# Patient Record
Sex: Male | Born: 1945
Health system: Southern US, Community
[De-identification: ages and names within clinical notes are randomized; demographics above are authoritative.]

## PROBLEM LIST (undated history)

## (undated) DIAGNOSIS — E039 Hypothyroidism, unspecified: Secondary | ICD-10-CM

## (undated) DIAGNOSIS — Z973 Presence of spectacles and contact lenses: Secondary | ICD-10-CM

## (undated) DIAGNOSIS — I2510 Atherosclerotic heart disease of native coronary artery without angina pectoris: Secondary | ICD-10-CM

## (undated) DIAGNOSIS — E785 Hyperlipidemia, unspecified: Secondary | ICD-10-CM

## (undated) DIAGNOSIS — Z9989 Dependence on other enabling machines and devices: Secondary | ICD-10-CM

## (undated) DIAGNOSIS — I10 Essential (primary) hypertension: Secondary | ICD-10-CM

## (undated) DIAGNOSIS — C61 Malignant neoplasm of prostate: Secondary | ICD-10-CM

## (undated) DIAGNOSIS — N400 Enlarged prostate without lower urinary tract symptoms: Secondary | ICD-10-CM

## (undated) DIAGNOSIS — R42 Dizziness and giddiness: Secondary | ICD-10-CM

## (undated) DIAGNOSIS — I7090 Unspecified atherosclerosis: Secondary | ICD-10-CM

## (undated) DIAGNOSIS — G3184 Mild cognitive impairment, so stated: Secondary | ICD-10-CM

## (undated) DIAGNOSIS — N3281 Overactive bladder: Secondary | ICD-10-CM

## (undated) DIAGNOSIS — M170 Bilateral primary osteoarthritis of knee: Secondary | ICD-10-CM

## (undated) DIAGNOSIS — R7303 Prediabetes: Secondary | ICD-10-CM

## (undated) HISTORY — PX: PROSTATE BIOPSY: SHX241

## (undated) HISTORY — PX: KNEE SURGERY: SHX244

## (undated) HISTORY — PX: FRACTURE SURGERY: SHX138

## (undated) HISTORY — PX: CORONARY ARTERY BYPASS GRAFT: SHX141

## (undated) HISTORY — PX: OTHER SURGICAL HISTORY: SHX169

## (undated) HISTORY — DX: Atherosclerotic heart disease of native coronary artery without angina pectoris: I25.10

## (undated) HISTORY — DX: Benign prostatic hyperplasia without lower urinary tract symptoms: N40.0

## (undated) HISTORY — DX: Hyperlipidemia, unspecified: E78.5

---

## 2014-02-25 ENCOUNTER — Encounter: Payer: Self-pay | Admitting: *Deleted

## 2017-01-12 ENCOUNTER — Encounter: Payer: Self-pay | Admitting: Family Medicine

## 2017-01-12 ENCOUNTER — Ambulatory Visit (INDEPENDENT_AMBULATORY_CARE_PROVIDER_SITE_OTHER): Payer: Medicare Other | Admitting: Family Medicine

## 2017-01-12 VITALS — BP 110/68 | HR 64 | Temp 98.0°F | Resp 14 | Ht 66.0 in | Wt 167.0 lb

## 2017-01-12 DIAGNOSIS — E7800 Pure hypercholesterolemia, unspecified: Secondary | ICD-10-CM

## 2017-01-12 DIAGNOSIS — Z125 Encounter for screening for malignant neoplasm of prostate: Secondary | ICD-10-CM

## 2017-01-12 DIAGNOSIS — Z7689 Persons encountering health services in other specified circumstances: Secondary | ICD-10-CM | POA: Diagnosis not present

## 2017-01-12 DIAGNOSIS — I2510 Atherosclerotic heart disease of native coronary artery without angina pectoris: Secondary | ICD-10-CM

## 2017-01-12 LAB — CBC WITH DIFFERENTIAL/PLATELET
Basophils Absolute: 0 cells/uL (ref 0–200)
Basophils Relative: 0 %
EOS PCT: 2 %
Eosinophils Absolute: 138 cells/uL (ref 15–500)
HCT: 48.8 % (ref 38.5–50.0)
Hemoglobin: 16.4 g/dL (ref 13.0–17.0)
LYMPHS PCT: 40 %
Lymphs Abs: 2760 cells/uL (ref 850–3900)
MCH: 29.8 pg (ref 27.0–33.0)
MCHC: 33.6 g/dL (ref 32.0–36.0)
MCV: 88.7 fL (ref 80.0–100.0)
MPV: 9.7 fL (ref 7.5–12.5)
Monocytes Absolute: 552 cells/uL (ref 200–950)
Monocytes Relative: 8 %
Neutro Abs: 3450 cells/uL (ref 1500–7800)
Neutrophils Relative %: 50 %
Platelets: 230 10*3/uL (ref 140–400)
RBC: 5.5 MIL/uL (ref 4.20–5.80)
RDW: 13.9 % (ref 11.0–15.0)
WBC: 6.9 10*3/uL (ref 3.8–10.8)

## 2017-01-12 LAB — COMPLETE METABOLIC PANEL WITH GFR
ALBUMIN: 4.1 g/dL (ref 3.6–5.1)
ALT: 31 U/L (ref 9–46)
AST: 23 U/L (ref 10–35)
Alkaline Phosphatase: 47 U/L (ref 40–115)
BUN: 14 mg/dL (ref 7–25)
CALCIUM: 9.4 mg/dL (ref 8.6–10.3)
CO2: 25 mmol/L (ref 20–31)
Chloride: 102 mmol/L (ref 98–110)
Creat: 1.18 mg/dL (ref 0.70–1.18)
GFR, Est African American: 72 mL/min (ref >=60)
GFR, Est Non African American: 62 mL/min (ref >=60)
Glucose, Bld: 90 mg/dL (ref 70–99)
Potassium: 4.7 mmol/L (ref 3.5–5.3)
SODIUM: 137 mmol/L (ref 135–146)
TOTAL PROTEIN: 6.8 g/dL (ref 6.1–8.1)
Total Bilirubin: 0.5 mg/dL (ref 0.2–1.2)

## 2017-01-12 LAB — LIPID PANEL
CHOL/HDL RATIO: 4 ratio (ref <5.0)
CHOLESTEROL: 159 mg/dL (ref <200)
HDL: 40 mg/dL — ABNORMAL LOW (ref >40)
LDL Cholesterol: 95 mg/dL (ref <100)
Triglycerides: 121 mg/dL (ref <150)
VLDL: 24 mg/dL (ref <30)

## 2017-01-12 NOTE — Progress Notes (Signed)
Subjective:    Patient ID: Corey Glover, male    DOB: 01/05/1946, 71 y.o.   MRN: [id number]  HPI Patient is a very pleasant 71 year old Caucasian male here today to establish care. In a proximally 1997, the patient states that he had a 6 vessel CABG performed by Dr. Laneta Simmers. At that time he was also under the care of Dr. Myrtis Ser.  Shortly thereafter, he lost his insurance when his company close the plantar. Since that time he has been seeking his medical care at the [place]. He has not been seen by cardiologist his knowledge in more than a decade. He has not had a stress test or echocardiogram performed in more than 15 years he believes. However he is asymptomatic and otherwise doing well. He denies any chest pain shortness of breath or dyspnea on exertion. He states that his last colonoscopy was in 2010 and he is due again in 2020. He is due for prostate cancer screening with a PSA. He states that he's had a hepatitis C screening test. He is certain that he has had both pneumonia vaccines including Pneumovax 23 as well as Prevnar 13. In epic, and states that he had a valve replacement. There is no other information provided. Patient denies that he has had a valve replaced. He denies ever having been on Coumadin or having a valve replaced.  I can find no other information in his chart and therefore I removed this from his history. Past Medical History:  Diagnosis Date  . CAD in native artery   . Hyperlipidemia    Past Surgical History:  Procedure Laterality Date  . CORONARY ARTERY BYPASS GRAFT     1997 6 vessel (Dr. Laneta Simmers)  . FRACTURE SURGERY     leg as a child   Current Outpatient Prescriptions on File Prior to Visit  Medication Sig Dispense Refill  . ezetimibe (ZETIA) 10 MG tablet Take 5 mg by mouth daily.    . metoprolol succinate (TOPROL-XL) 25 MG 24 hr tablet Take 12.5 mg by mouth daily.    . rosuvastatin (CRESTOR) 40 MG tablet Take 20 mg by mouth every other day.    . vitamin B-12  (CYANOCOBALAMIN) 1000 MCG tablet Take 1,000 mcg by mouth daily.     No current facility-administered medications on file prior to visit.    No Known Allergies Social History   Social History  . Marital status: Single    Spouse name: N/A  . Number of children: N/A  . Years of education: N/A   Occupational History  . Not on file.   Social History Main Topics  . Smoking status: Never Smoker  . Smokeless tobacco: Never Used  . Alcohol use No  . Drug use: No  . Sexual activity: Not Currently   Other Topics Concern  . Not on file   Social History Narrative  . No narrative on file   Family History  Problem Relation Age of Onset  . Heart disease Father   . Heart disease Brother    Brother also has cancer   Review of Systems  All other systems reviewed and are negative.      Objective:   Physical Exam  Constitutional: He is oriented to person, place, and time. He appears well-developed and well-nourished. No distress.  HENT:  Head: Normocephalic and atraumatic.  Right Ear: External ear normal.  Left Ear: External ear normal.  Nose: Nose normal.  Mouth/Throat: Oropharynx is clear and moist. No oropharyngeal exudate.  Eyes: Pupils are equal, round, and reactive to light. Conjunctivae and EOM are normal. Right eye exhibits no discharge. Left eye exhibits no discharge. No scleral icterus.  Neck: Normal range of motion. Neck supple. No JVD present. No tracheal deviation present. No thyromegaly present.  Cardiovascular: Normal rate, regular rhythm, normal heart sounds and intact distal pulses.  Exam reveals no gallop and no friction rub.   No murmur heard. Pulmonary/Chest: Effort normal and breath sounds normal. No stridor. No respiratory distress. He has no wheezes. He has no rales. He exhibits no tenderness.  Abdominal: Soft. Bowel sounds are normal. He exhibits no distension and no mass. There is no tenderness. There is no rebound and no guarding.  Musculoskeletal: He  exhibits no edema.  Lymphadenopathy:    He has no cervical adenopathy.  Neurological: He is alert and oriented to person, place, and time. He has normal reflexes. No cranial nerve deficit. He exhibits normal muscle tone. Coordination normal.  Skin: Skin is warm. No rash noted. He is not diaphoretic. No erythema. No pallor.  Vitals reviewed.         Assessment & Plan:  Prostate cancer screening - Plan: PSA  CAD in native artery - Plan: CBC with Differential/Platelet, Lipid panel, COMPLETE METABOLIC PANEL WITH GFR, Ambulatory referral to Cardiology  Encounter to establish care with new doctor  Pure hypercholesterolemia  Patient's exam today is unremarkable. Immunizations are up-to-date. He also reports that he's had a shingles vaccine. Hepatitis C screening is up-to-date. Colonoscopy is not due again until 2020. His blood pressure today is excellent. He is on an aspirin. I will check a fasting lipid panel. Goal LDL cholesterol is less than 70. I will also check a CBC as well as a CMP. I will screen the patient for prostate cancer with a PSA. I am concerned by the fact the patient has not seen a cardiologist in more than a decade given his past history. I think it will be prudent to reestablish him with his cardiologist. He may require a stress test given his previous history. However the patient is asymptomatic and therefore I do not believe that this is urgent.

## 2017-01-13 LAB — PSA: PSA: 1.4 ng/mL (ref <=4.0)

## 2017-01-14 ENCOUNTER — Encounter: Payer: Self-pay | Admitting: Family Medicine

## 2017-01-30 ENCOUNTER — Encounter: Payer: Self-pay | Admitting: *Deleted

## 2017-02-01 NOTE — Progress Notes (Signed)
Cardiology Office Note   Date:  02/02/2017   ID:  Corey Glover, DOB 04-15-1946, MRN [id number]  PCP:  Donita Brooks, MD  Cardiologist:   Charlton Haws, MD   No chief complaint on file.     History of Present Illness: Corey Glover is a 71 y.o. male who presents for consultation regarding CAD and distant CABG. Referred by Dr Tanya Nones. Previousl seen by Dr Myrtis Ser. Had CABG with Dr Laneta Simmers in 1997 CRF;s include hyperlipidemia on statin and zetia. 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 Has arthritis and myalgias with statins  [place] and found to have CAD Has never had SSCP. No dyspnea palpitations or syncope Occasional indigestions That improves with burping    Past Medical History:  Diagnosis Date  . CAD in native artery   . Hyperlipidemia     Past Surgical History:  Procedure Laterality Date  . CORONARY ARTERY BYPASS GRAFT     1997 6 vessel (Dr. Laneta Simmers)  . FRACTURE SURGERY     leg as a child     Current Outpatient Prescriptions  Medication Sig Dispense Refill  . aspirin EC 81 MG tablet Take 81 mg by mouth daily.    Marland Kitchen ezetimibe (ZETIA) 10 MG tablet Take 5 mg by mouth daily.    . metoprolol succinate (TOPROL-XL) 25 MG 24 hr tablet Take 12.5 mg by mouth daily.    . rosuvastatin (CRESTOR) 40 MG tablet Take 20 mg by mouth every other day.     No current facility-administered medications for this visit.     Allergies:   Patient has no known allergies.    Social History:  The patient  reports that he has never smoked. He has never used smokeless tobacco. He reports that he does not drink alcohol or use drugs.   Family History:  The patient's family history includes Heart disease in his brother and father.    ROS:  Please see the history of present illness.   Otherwise, review of systems are positive for none.   All other systems are reviewed  and negative.    PHYSICAL EXAM: VS:  BP 114/74 (BP Location: Left Arm)   Pulse 82   Ht 5\' 6"  (1.676 m)   Wt 168 lb (76.2 kg)   SpO2 96%   BMI 27.12 kg/m  , BMI Body mass index is 27.12 kg/m. Affect appropriate Healthy:  appears stated age HEENT: normal Neck supple with no adenopathy JVP normal no bruits no thyromegaly Lungs clear with no wheezing and good diaphragmatic motion Heart:  S1/S2 no murmur, no rub, gallop or click PMI normal Abdomen: benighn, BS positve, no tenderness, no AAA no bruit.  No HSM or HJR Distal pulses intact with no bruits No edema Neuro non-focal Skin warm and dry No muscular weakness    EKG:  SR rate 74 ICRBBB possible old IMI    Recent Labs: 01/12/2017: ALT 31; BUN 14; Creat 1.18; Hemoglobin 16.4; Platelets 230; Potassium 4.7; Sodium 137    Lipid Panel    Component Value Date/Time   CHOL 159 01/12/2017 1054   TRIG 121 01/12/2017 1054   HDL 40 (L) 01/12/2017 1054   CHOLHDL 4.0 01/12/2017 1054   VLDL 24 01/12/2017 1054   LDLCALC 95 01/12/2017 1054      Wt Readings from Last 3 Encounters:  02/02/17 168 lb (76.2 kg)  01/12/17 167 lb (75.8 kg)      Other studies Reviewed: Additional studies/ records that were reviewed today include: Notes primary and labs Old CABG report 73 [address].    ASSESSMENT AND PLAN:  1.  CAD/CABG:  Distant 1997 Dr Laneta Simmers Asymptomatic f/u Ex Myovue since he had no warning signs beofre And grafts more than 71 years old  2. Cholesterol. LDL under 100 continue statin  3.    Current medicines are reviewed at length with the patient today.  The patient does not have concerns regarding medicines.  The following changes have been made:  no change  Labs/ tests ordered today include: Ex Myovue   Orders Placed This Encounter  Procedures  . EKG 12-Lead     Disposition:   FU with cardiology in a year      Signed, Charlton Haws, MD  02/02/2017 1:52 PM    [facility] [address] [place], [place], [place]  [address] Phone: [phone]; Fax: [phone]

## 2017-02-02 ENCOUNTER — Encounter: Payer: Self-pay | Admitting: Cardiovascular Disease

## 2017-02-02 ENCOUNTER — Ambulatory Visit (INDEPENDENT_AMBULATORY_CARE_PROVIDER_SITE_OTHER): Payer: Medicare Other | Admitting: Cardiovascular Disease

## 2017-02-02 VITALS — BP 114/74 | HR 82 | Ht 66.0 in | Wt 168.0 lb

## 2017-02-02 DIAGNOSIS — I25119 Atherosclerotic heart disease of native coronary artery with unspecified angina pectoris: Secondary | ICD-10-CM | POA: Diagnosis not present

## 2017-02-02 NOTE — Patient Instructions (Signed)
Medication Instructions:  Your physician recommends that you continue on your current medications as directed. Please refer to the Current Medication list given to you today.   Labwork: NONE  Testing/Procedures: Your physician has requested that you have en exercise stress myoview. For further information please visit [email or web address]. Please follow instruction sheet, as given.    Follow-Up: Your physician wants you to follow-up in: 1 YEAR .  You will receive a reminder letter in the mail two months in advance. If you don't receive a letter, please call our office to schedule the follow-up appointment.   Any Other Special Instructions Will Be Listed Below (If Applicable).     If you need a refill on your cardiac medications before your next appointment, please call your pharmacy.   

## 2017-02-02 NOTE — Addendum Note (Signed)
Addended by: Abelino Derrick R on: 02/02/2017 01:58 PM   Modules accepted: Orders

## 2017-02-06 ENCOUNTER — Encounter (HOSPITAL_COMMUNITY)
Admission: RE | Admit: 2017-02-06 | Discharge: 2017-02-06 | Disposition: A | Discharge status: Home / Self Care | Payer: Medicare Other | Source: Ambulatory Visit | Attending: Cardiovascular Disease | Admitting: Cardiovascular Disease

## 2017-02-06 ENCOUNTER — Encounter (HOSPITAL_BASED_OUTPATIENT_CLINIC_OR_DEPARTMENT_OTHER)
Admission: RE | Admit: 2017-02-06 | Discharge: 2017-02-06 | Disposition: A | Discharge status: Home / Self Care | Payer: Medicare Other | Source: Ambulatory Visit | Attending: Cardiovascular Disease | Admitting: Cardiovascular Disease

## 2017-02-06 ENCOUNTER — Encounter (HOSPITAL_COMMUNITY): Payer: Self-pay

## 2017-02-06 DIAGNOSIS — I25119 Atherosclerotic heart disease of native coronary artery with unspecified angina pectoris: Secondary | ICD-10-CM

## 2017-02-06 LAB — NM MYOCAR MULTI W/SPECT W/WALL MOTION / EF
CHL CUP NUCLEAR SDS: 0
CHL CUP RESTING HR STRESS: 58 {beats}/min
CSEPEDS: 1 s
CSEPPHR: 118 {beats}/min
Estimated workload: 10.1 METS
Exercise duration (min): 7 min
LVDIAVOL: 75 mL (ref 62–150)
LVSYSVOL: 29 mL
MPHR: 150 {beats}/min
Percent HR: 78 %
RATE: 0.34
RPE: 13
SRS: 1
SSS: 1
TID: 1.18

## 2017-02-06 MED ORDER — SODIUM CHLORIDE 0.9% FLUSH
INTRAVENOUS | Status: AC
  Administered 2017-02-06: 10 mL via INTRAVENOUS
  Filled 2017-02-06: qty 10

## 2017-02-06 MED ORDER — TECHNETIUM TC 99M TETROFOSMIN IV KIT
10.0000 | PACK | Freq: Once | INTRAVENOUS | Status: AC | PRN
  Administered 2017-02-06: 11 via INTRAVENOUS

## 2017-02-06 MED ORDER — TECHNETIUM TC 99M TETROFOSMIN IV KIT
30.0000 | PACK | Freq: Once | INTRAVENOUS | Status: AC | PRN
  Administered 2017-02-06: 33 via INTRAVENOUS

## 2017-02-06 MED ORDER — REGADENOSON 0.4 MG/5ML IV SOLN
INTRAVENOUS | Status: AC
  Administered 2017-02-06: 0.4 mg via INTRAVENOUS
  Filled 2017-02-06: qty 5

## 2020-07-02 ENCOUNTER — Encounter (INDEPENDENT_AMBULATORY_CARE_PROVIDER_SITE_OTHER): Payer: Self-pay | Admitting: Internal Medicine

## 2020-07-02 ENCOUNTER — Ambulatory Visit (INDEPENDENT_AMBULATORY_CARE_PROVIDER_SITE_OTHER): Payer: Medicare Other | Admitting: Internal Medicine

## 2020-07-02 ENCOUNTER — Other Ambulatory Visit: Payer: Self-pay

## 2020-07-02 VITALS — BP 114/78 | HR 59 | Temp 97.5°F | Ht 63.0 in | Wt 170.8 lb

## 2020-07-02 DIAGNOSIS — I2510 Atherosclerotic heart disease of native coronary artery without angina pectoris: Secondary | ICD-10-CM | POA: Diagnosis not present

## 2020-07-02 DIAGNOSIS — K625 Hemorrhage of anus and rectum: Secondary | ICD-10-CM

## 2020-07-02 DIAGNOSIS — E785 Hyperlipidemia, unspecified: Secondary | ICD-10-CM | POA: Diagnosis not present

## 2020-07-02 DIAGNOSIS — N401 Enlarged prostate with lower urinary tract symptoms: Secondary | ICD-10-CM | POA: Diagnosis not present

## 2020-07-02 DIAGNOSIS — M25562 Pain in left knee: Secondary | ICD-10-CM

## 2020-07-02 DIAGNOSIS — R350 Frequency of micturition: Secondary | ICD-10-CM

## 2020-07-02 DIAGNOSIS — G8929 Other chronic pain: Secondary | ICD-10-CM

## 2020-07-02 NOTE — Progress Notes (Signed)
Metrics: Intervention Frequency ACO  Documented Smoking Status Yearly  Screened one or more times in 24 months  Cessation Counseling or  Active cessation medication Past 24 months  Past 24 months   Guideline developer: UpToDate (See UpToDate for funding source) Date Released: 2014       Wellness Office Visit  Subjective:  Patient ID: Corey Glover, male    DOB: 1945/10/02  Age: 75 y.o. MRN: [id number]  CC: This 75 year old man comes to our practice as a new patient to establish care. His previous physician apparently is not in his network regarding his insurance and he was given the information from his insurance, need to come and see [place].  HPI  He has a history of coronary artery disease, having had CABG in 1997. He has not seen a cardiologist for many years and thankfully has not had any problems either in terms of symptoms. He also has dyslipidemia and takes statin and Zetia. He also has BPH and takes tamsulosin which seems to help him. Currently he is having problem with rectal bleeding any feels that this is hemorrhoids. He has not had a colonoscopy for many years. He is also complaining of left knee pain which has been present for at least the last 1 year. It seems to be getting worse. At times he has had swelling of the left knee. Past Medical History:  Diagnosis Date  . CAD in native artery   . Hyperlipidemia    Past Surgical History:  Procedure Laterality Date  . CORONARY ARTERY BYPASS GRAFT     1997 6 vessel (Dr. Laneta Simmers)  . FRACTURE SURGERY     leg as a child     Family History  Problem Relation Age of Onset  . Heart disease Father   . Heart disease Brother     Social History   Social History Narrative   Divorced since 2000.Son lives with him.Retired ,used to work in Publishing rights manager.Ex-military.   Social History   Tobacco Use  . Smoking status: Never Smoker  . Smokeless tobacco: Never Used  Substance Use Topics  . Alcohol use: No    Current Meds   Medication Sig  . aspirin EC 81 MG tablet Take 81 mg by mouth daily.  . cholecalciferol (VITAMIN D3) 25 MCG (1000 UNIT) tablet Take 1,000 Units by mouth daily.  Marland Kitchen ezetimibe (ZETIA) 10 MG tablet Take 5 mg by mouth daily.  . metoprolol succinate (TOPROL-XL) 25 MG 24 hr tablet Take 12.5 mg by mouth daily.  . pravastatin (PRAVACHOL) 40 MG tablet Take 40 mg by mouth daily.  . tamsulosin (FLOMAX) 0.4 MG CAPS capsule Take 0.4 mg by mouth.      Depression screen [facility] 2/9 07/02/2020  Decreased Interest 0  Down, Depressed, Hopeless 0  PHQ - 2 Score 0  Altered sleeping 0  Tired, decreased energy 0  Change in appetite 0  Feeling bad or failure about yourself  0  Trouble concentrating 0  Moving slowly or fidgety/restless 0  Suicidal thoughts 0  PHQ-9 Score 0  Difficult doing work/chores Not difficult at all     Objective:   Today's Vitals: BP 114/78   Pulse (!) 59   Temp (!) 97.5 F (36.4 C) (Temporal)   Ht 5\' 3"  (1.6 m) Comment: Patient had his shoes on  Wt 170 lb 12.8 oz (77.5 kg)   SpO2 96%   BMI 30.26 kg/m  Vitals with BMI 07/02/2020 02/02/2017 02/02/2017  Height 5\' 3"  - 5\' 6"   Weight 170 lbs 13 oz - 168 lbs  BMI 30.26 - 27.2  Systolic 114 114 062  Diastolic 78 74 78  Pulse 59 - 82     Physical Exam  He looks somewhat frail for his age. Blood pressure is in good control. No swelling of the left knee. Alert and orientated without any obvious focal neurological signs.     Assessment   1. Rectal bleeding   2. CAD in native artery   3. Dyslipidemia   4. Benign prostatic hyperplasia with urinary frequency   5. Chronic pain of left knee       Tests ordered Orders Placed This Encounter  Procedures  . CBC  . COMPLETE METABOLIC PANEL WITH GFR  . PSA, Total with Reflex to PSA, Free  . Lipid panel  . Ambulatory referral to Gastroenterology  . Ambulatory referral to Orthopedic Surgery     Plan: 1. As far as his rectal bleeding is concerned, I will refer him to  gastroenterology. 2. I will refer him to orthopedics for his chronic left knee pain. He may well have significant osteoarthritis which may require treatment definitively. 3. Blood work is ordered above. 4. He will continue continue with tamsulosin for his BPH. 5. Will continue with statin therapy as well as Zetia for his dyslipidemia. 6. Further recommendations will depend on results and I will have him follow-up with Sarah in about 3 months time.   No orders of the defined types were placed in this encounter.   Wilson Singer, MD

## 2020-07-03 ENCOUNTER — Encounter: Payer: Self-pay | Admitting: Internal Medicine

## 2020-07-03 LAB — COMPLETE METABOLIC PANEL WITH GFR
AG Ratio: 1.6 (calc) (ref 1.0–2.5)
ALT: 32 U/L (ref 9–46)
AST: 19 U/L (ref 10–35)
Albumin: 4 g/dL (ref 3.6–5.1)
Alkaline phosphatase (APISO): 41 U/L (ref 35–144)
BUN: 16 mg/dL (ref 7–25)
CO2: 27 mmol/L (ref 20–32)
Calcium: 9.5 mg/dL (ref 8.6–10.3)
Chloride: 104 mmol/L (ref 98–110)
Creat: 1.11 mg/dL (ref 0.70–1.18)
GFR, Est African American: 75 mL/min/{1.73_m2} (ref >=60)
GFR, Est Non African American: 65 mL/min/{1.73_m2} (ref >=60)
Globulin: 2.5 g/dL (calc) (ref 1.9–3.7)
Glucose, Bld: 99 mg/dL (ref 65–139)
Potassium: 4.8 mmol/L (ref 3.5–5.3)
Sodium: 137 mmol/L (ref 135–146)
Total Bilirubin: 0.3 mg/dL (ref 0.2–1.2)
Total Protein: 6.5 g/dL (ref 6.1–8.1)

## 2020-07-03 LAB — CBC
HCT: 46.2 % (ref 38.5–50.0)
Hemoglobin: 15.7 g/dL (ref 13.2–17.1)
MCH: 29.5 pg (ref 27.0–33.0)
MCHC: 34 g/dL (ref 32.0–36.0)
MCV: 86.7 fL (ref 80.0–100.0)
MPV: 10.5 fL (ref 7.5–12.5)
Platelets: 227 10*3/uL (ref 140–400)
RBC: 5.33 10*6/uL (ref 4.20–5.80)
RDW: 13.1 % (ref 11.0–15.0)
WBC: 6.4 10*3/uL (ref 3.8–10.8)

## 2020-07-03 LAB — LIPID PANEL
Cholesterol: 141 mg/dL (ref <200)
HDL: 41 mg/dL (ref >=40)
LDL Cholesterol (Calc): 84 mg/dL (calc)
Non-HDL Cholesterol (Calc): 100 mg/dL (calc) (ref <130)
Total CHOL/HDL Ratio: 3.4 (calc) (ref <5.0)
Triglycerides: 73 mg/dL (ref <150)

## 2020-07-03 LAB — PSA, TOTAL WITH REFLEX TO PSA, FREE: PSA, Total: 0.3 ng/mL (ref <=4.0)

## 2020-07-04 ENCOUNTER — Encounter (INDEPENDENT_AMBULATORY_CARE_PROVIDER_SITE_OTHER): Payer: Self-pay | Admitting: Internal Medicine

## 2020-07-11 ENCOUNTER — Ambulatory Visit (INDEPENDENT_AMBULATORY_CARE_PROVIDER_SITE_OTHER): Payer: Medicare Other | Admitting: Orthopedic Surgery

## 2020-07-11 ENCOUNTER — Ambulatory Visit: Payer: Medicare Other

## 2020-07-11 ENCOUNTER — Encounter: Payer: Self-pay | Admitting: Orthopedic Surgery

## 2020-07-11 ENCOUNTER — Other Ambulatory Visit: Payer: Self-pay

## 2020-07-11 VITALS — BP 137/74 | HR 70 | Ht 63.0 in | Wt 165.0 lb

## 2020-07-11 DIAGNOSIS — M1711 Unilateral primary osteoarthritis, right knee: Secondary | ICD-10-CM

## 2020-07-11 DIAGNOSIS — G8929 Other chronic pain: Secondary | ICD-10-CM

## 2020-07-11 DIAGNOSIS — M1712 Unilateral primary osteoarthritis, left knee: Secondary | ICD-10-CM | POA: Diagnosis not present

## 2020-07-11 NOTE — Progress Notes (Signed)
New Patient Visit  Assessment: Corey Glover is a 75 y.o. male with the following:  Plan: SHUNSUKE GRANZOW has arthritis in bilateral knees.  We reviewed the radiographs in clinic today, and I outlined the natural progression.  His right knee radiographs demonstrates severe arthritis, but this remains asymptomatic.  His left knee demonstrates moderate arthritis on radiographs, but currently has significant pain.    We had an extensive discussion regarding all potential treatment options, including continuing with the current treatment. NSAIDs are the most appropriate medications, and these are available OTC or via prescription.  I have urged them to remain active, and they can continue with activities on their own, or we can refer them to physical therapy.  We can also consider a brace, or compression sleeve. If the pain is severe enough, we can consider a steroid injection.  If their knee pain is affecting their everyday activities, including sleep, knee replacement is a consideration, but we would have to refer them to see my partner Dr. Romeo Apple.  After discussing all of these options, the patient has elected to proceed with his current regimen of topical ointments, as well as a left knee steroid injection.  Procedure note injection Left knee joint   Verbal consent was obtained to inject the left knee joint  Timeout was completed to confirm the site of injection.  The skin was prepped with alcohol and ethyl chloride was sprayed at the injection site.  A 21-gauge needle was used to inject 40 mg of Depo-Medrol and 1% lidocaine (3 cc) into the left knee using an anterolateral approach.  There were no complications. A sterile bandage was applied.     Follow-up: Return if symptoms worsen or fail to improve.  Subjective:  Chief Complaint  Patient presents with  . Knee Pain    Bilateral but Lt > Rt.  Getting worse over past 2-3 yrs.    History of Present Illness: Corey Glover  is a 75 y.o. male who has been referred to clinic today by Lilly Cove, MD for left knee pain.  He has had pain in the left knee for the past 3-4 months.  No specific injury.  He does not note any swelling.  He is currently using 2 different types of topical medications with some improvement in his symptoms.  He has never had an injection.  He has not worked with physical therapy.  He has some pain in his right knee, but this is tolerable at this time.  He notes a remote history of surgery on his right knee for a torn ligament.  The pain in his left knee is primarily posterior aspect, as well as some pain medially.   Review of Systems: No fevers or chills No numbness or tingling No chest pain No shortness of breath No bowel or bladder dysfunction No GI distress No headaches   Medical History:  Past Medical History:  Diagnosis Date  . CAD in native artery   . Hyperlipidemia     Past Surgical History:  Procedure Laterality Date  . CORONARY ARTERY BYPASS GRAFT     1997 6 vessel (Dr. Laneta Simmers)  . FRACTURE SURGERY     leg as a child    Family History  Problem Relation Age of Onset  . Heart disease Father   . Heart disease Brother    Social History   Tobacco Use  . Smoking status: Never Smoker  . Smokeless tobacco: Never Used  Vaping Use  . Vaping  Use: Never used  Substance Use Topics  . Alcohol use: No  . Drug use: No    No Known Allergies  Current Meds  Medication Sig  . aspirin EC 81 MG tablet Take 81 mg by mouth daily.  . cholecalciferol (VITAMIN D3) 25 MCG (1000 UNIT) tablet Take 1,000 Units by mouth daily.  Marland Kitchen ezetimibe (ZETIA) 10 MG tablet Take 5 mg by mouth daily.  . metoprolol succinate (TOPROL-XL) 25 MG 24 hr tablet Take 12.5 mg by mouth daily.  . pravastatin (PRAVACHOL) 40 MG tablet Take 40 mg by mouth daily.  . tamsulosin (FLOMAX) 0.4 MG CAPS capsule Take 0.4 mg by mouth.    Objective: BP 137/74   Pulse 70   Ht 5\' 3"  (1.6 m)   Wt 165 lb (74.8 kg)    BMI 29.23 kg/m   Physical Exam:  General: Alert and oriented, no acute distress Gait: Slow, left-sided antalgic gait  Evaluation of the right knee demonstrates well-healed surgical incisions without surrounding erythema or drainage.  No effusion is appreciated.  Range of motion from 10-120 degrees.  Mild crepitus is appreciated.  Negative Lachman.  No tenderness palpation over the medial lateral joint lines.  Baker's cyst is palpated posterior aspect of the knee, which is nontender.  Evaluation of the left knee demonstrates no effusion.  He has full range of motion from 0-130 degrees without discomfort.  Mild crepitus is appreciated.  Negative Lachman.  Mild tenderness palpation along the medial and lateral joint lines.  Tenderness palpation of posterior aspect of the knee consistent with his Baker's cyst.  Sensation is intact distally bilaterally    IMAGING: I personally ordered and reviewed the following images  X-ray of the right knee demonstrates advanced degenerative changes with bone-on-bone articulations within the medial lateral compartments.  There are significant osteophytes within the patellofemoral compartment with almost complete loss of joint space.  Impression: Severe right knee arthritis  X-ray of the left knee demonstrates moderate degenerative changes with loss of joint space within the medial and lateral compartments.  Some osteophytes are appreciated within the patellofemoral compartment.  Impression: Moderate left knee arthritis   New Medications:  No orders of the defined types were placed in this encounter.     , MD  07/11/2020 12:08 PM

## 2020-07-11 NOTE — Patient Instructions (Signed)

## 2020-08-07 ENCOUNTER — Ambulatory Visit: Payer: Medicare Other | Admitting: Gastroenterology

## 2020-08-07 ENCOUNTER — Encounter: Payer: Self-pay | Admitting: Gastroenterology

## 2020-08-07 ENCOUNTER — Other Ambulatory Visit: Payer: Self-pay

## 2020-08-07 DIAGNOSIS — K625 Hemorrhage of anus and rectum: Secondary | ICD-10-CM

## 2020-08-07 DIAGNOSIS — Z9889 Other specified postprocedural states: Secondary | ICD-10-CM | POA: Diagnosis not present

## 2020-08-07 DIAGNOSIS — Z86010 Personal history of colonic polyps: Secondary | ICD-10-CM | POA: Diagnosis not present

## 2020-08-07 NOTE — Progress Notes (Signed)
Primary Care Physician:  Elenore Paddy, NP  Primary Gastroenterologist:  Hennie Duos. Marletta Lor, DO   Chief Complaint  Patient presents with  . Rectal Bleeding    Past few weeks, sees blood when wipes. Last tcs approx >10 years ago at [place]    HPI:  Corey Glover is a 75 y.o. male here at the request of Dr. Karilyn Cota for further evaluation of rectal bleeding.  Patient has noted toilet tissue hematochezia for several weeks.  Symptoms associated with perianal itching.  Feels like it is difficult to clean up after bowel movement.  Has to wipe several times and go back to clean again.  Generally has a bowel movement every day. BMs more complete since starting Metamucil last week.  Denies rectal pain.  Denies any specific treatments for hemorrhoids.  He uses some sort of [employer] cream on his bottom when he has itching.  He is not sure what kind of cream it is.  Remote colonoscopy through [place], found some polyps. Not sure when he was supposed to follow up.  No family history of colon cancer.  Current Outpatient Medications  Medication Sig Dispense Refill  . aspirin EC 81 MG tablet Take 81 mg by mouth daily.    . cholecalciferol (VITAMIN D3) 25 MCG (1000 UNIT) tablet Take 1,000 Units by mouth daily.    Marland Kitchen ezetimibe (ZETIA) 10 MG tablet Take 5 mg by mouth daily.    . metoprolol succinate (TOPROL-XL) 25 MG 24 hr tablet Take 12.5 mg by mouth daily.    . pravastatin (PRAVACHOL) 40 MG tablet Take 40 mg by mouth daily.    . tamsulosin (FLOMAX) 0.4 MG CAPS capsule Take 0.4 mg by mouth.     No current facility-administered medications for this visit.    Allergies as of 08/07/2020  . (No Known Allergies)    Past Medical History:  Diagnosis Date  . BPH (benign prostatic hyperplasia)   . CAD in native artery   . Hyperlipidemia     Past Surgical History:  Procedure Laterality Date  . CORONARY ARTERY BYPASS GRAFT     1997 6 vessel (Dr. Laneta Simmers)  . KNEE SURGERY     leg as a child    Family History   Problem Relation Age of Onset  . Heart disease Father   . Heart disease Brother   . Colon cancer Neg Hx     Social History   Socioeconomic History  . Marital status: Single    Spouse name: Not on file  . Number of children: Not on file  . Years of education: Not on file  . Highest education level: Not on file  Occupational History  . Not on file  Tobacco Use  . Smoking status: Never Smoker  . Smokeless tobacco: Never Used  Vaping Use  . Vaping Use: Never used  Substance and Sexual Activity  . Alcohol use: No  . Drug use: No  . Sexual activity: Not Currently  Other Topics Concern  . Not on file  Social History Narrative   Divorced since 2000.Son lives with him.Retired ,used to work in Publishing rights manager.Ex-military.   Social Determinants of Health   Financial Resource Strain: Not on file  Food Insecurity: Not on file  Transportation Needs: Not on file  Physical Activity: Not on file  Stress: Not on file  Social Connections: Not on file  Intimate Partner Violence: Not on file      ROS:  General: Negative for anorexia, weight loss, fever,  chills, fatigue, weakness. Eyes: Negative for vision changes.  ENT: Negative for hoarseness, difficulty swallowing , nasal congestion. CV: Negative for chest pain, angina, palpitations, dyspnea on exertion, peripheral edema.  Respiratory: Negative for dyspnea at rest, dyspnea on exertion, cough, sputum, wheezing.  GI: See history of present illness. GU:  Negative for dysuria, hematuria, urinary incontinence, urinary frequency, nocturnal urination.  MS: Positive for joint pain, especially his knees.  No low back pain.  Derm: Negative for rash or itching.  Neuro: Negative for weakness, abnormal sensation, seizure, frequent headaches, memory loss, confusion.  Psych: Negative for anxiety, depression, suicidal ideation, hallucinations.  Endo: Negative for unusual weight change.  Heme: Negative for bruising or bleeding. Allergy:  Negative for rash or hives.    Physical Examination:  BP 124/76   Pulse 62   Temp 98.2 F (36.8 C) (Temporal)   Ht 5\' 5"  (1.651 m)   Wt 170 lb 6.4 oz (77.3 kg)   BMI 28.36 kg/m    General: Well-nourished, well-developed in no acute distress.  Head: Normocephalic, atraumatic.   Eyes: Conjunctiva pink, no icterus. Mouth: not performed Neck: Supple without thyromegaly, masses, or lymphadenopathy.  Lungs: Clear to auscultation bilaterally.  Heart: Regular rate and rhythm, no murmurs rubs or gallops.  Abdomen: Bowel sounds are normal, nontender, nondistended, no hepatosplenomegaly or masses, no abdominal bruits or    hernia , no rebound or guarding.   Rectal: not performed Extremities: No lower extremity edema. No clubbing or deformities.  Neuro: Alert and oriented x 4 , grossly normal neurologically.  Skin: Warm and dry, no rash or jaundice.   Psych: Alert and cooperative, normal mood and affect.  Labs: Lab Results  Component Value Date   CREATININE 1.11 07/02/2020   BUN 16 07/02/2020   NA 137 07/02/2020   K 4.8 07/02/2020   CL 104 07/02/2020   CO2 27 07/02/2020   Lab Results  Component Value Date   ALT 32 07/02/2020   AST 19 07/02/2020   ALKPHOS 47 01/12/2017   BILITOT 0.3 07/02/2020   Lab Results  Component Value Date   WBC 6.4 07/02/2020   HGB 15.7 07/02/2020   HCT 46.2 07/02/2020   MCV 86.7 07/02/2020   PLT 227 07/02/2020   Lab Results  Component Value Date   PSA 1.4 01/12/2017      Imaging Studies: DG Knee 4 Views W/Patella Left  Result Date: 07/11/2020 X-ray of the left knee demonstrates moderate degenerative changes with loss of joint space within the medial and lateral compartments.  Some osteophytes are appreciated within the patellofemoral compartment.  Impression: Moderate left knee arthritis  DG Knee 4 Views W/Patella Right  Result Date: 07/11/2020 X-ray of the right knee demonstrates advanced degenerative changes with bone-on-bone  articulations within the medial lateral compartments.  There are significant osteophytes within the patellofemoral compartment with almost complete loss of joint space.  Impression: Severe right knee arthritis   Assessment:  75 year old male with history of colon polyps on remote colonoscopy, perianal itching/rectal bleeding presenting for further evaluation.  I suspect his symptoms are due to benign anorectal source such as hemorrhoids however warrants evaluation via colonoscopy since his last one was more than 10 years ago and he has a history of colon polyps.  If his colonoscopy is unremarkable, he may be a candidate for hemorrhoid banding in the office.  Discussed with patient briefly today.  Plan:  1. Colonoscopy in the near future with Dr. 66. ASA II.  I have discussed the risks,  alternatives, benefits with regards to but not limited to the risk of reaction to medication, bleeding, infection, perforation and the patient is agreeable to proceed. Written consent to be obtained. 2. Preparation H anorectal he twice daily for 2 weeks. 3. Continue Metamucil daily.

## 2020-08-07 NOTE — Patient Instructions (Signed)
1. Purchase hemorrhoid cream such as Preparation H over-the-counter. You can use a gloved finger with hemorrhoid cream and insert just inside the anus twice per day for 2 weeks.  2. Continue metamucil daily. 3. Colonoscopy as scheduled. See separate instructions.

## 2020-08-07 NOTE — H&P (View-Only) (Signed)
Primary Care Physician:  Elenore Paddy, NP  Primary Gastroenterologist:  Hennie Duos. Marletta Lor, DO   Chief Complaint  Patient presents with  . Rectal Bleeding    Past few weeks, sees blood when wipes. Last tcs approx >10 years ago at [place]    HPI:  Corey Glover is a 75 y.o. male here at the request of Dr. Karilyn Cota for further evaluation of rectal bleeding.  Patient has noted toilet tissue hematochezia for several weeks.  Symptoms associated with perianal itching.  Feels like it is difficult to clean up after bowel movement.  Has to wipe several times and go back to clean again.  Generally has a bowel movement every day. BMs more complete since starting Metamucil last week.  Denies rectal pain.  Denies any specific treatments for hemorrhoids.  He uses some sort of [employer] cream on his bottom when he has itching.  He is not sure what kind of cream it is.  Remote colonoscopy through [place], found some polyps. Not sure when he was supposed to follow up.  No family history of colon cancer.  Current Outpatient Medications  Medication Sig Dispense Refill  . aspirin EC 81 MG tablet Take 81 mg by mouth daily.    . cholecalciferol (VITAMIN D3) 25 MCG (1000 UNIT) tablet Take 1,000 Units by mouth daily.    Marland Kitchen ezetimibe (ZETIA) 10 MG tablet Take 5 mg by mouth daily.    . metoprolol succinate (TOPROL-XL) 25 MG 24 hr tablet Take 12.5 mg by mouth daily.    . pravastatin (PRAVACHOL) 40 MG tablet Take 40 mg by mouth daily.    . tamsulosin (FLOMAX) 0.4 MG CAPS capsule Take 0.4 mg by mouth.     No current facility-administered medications for this visit.    Allergies as of 08/07/2020  . (No Known Allergies)    Past Medical History:  Diagnosis Date  . BPH (benign prostatic hyperplasia)   . CAD in native artery   . Hyperlipidemia     Past Surgical History:  Procedure Laterality Date  . CORONARY ARTERY BYPASS GRAFT     1997 6 vessel (Dr. Laneta Simmers)  . KNEE SURGERY     leg as a child    Family History   Problem Relation Age of Onset  . Heart disease Father   . Heart disease Brother   . Colon cancer Neg Hx     Social History   Socioeconomic History  . Marital status: Single    Spouse name: Not on file  . Number of children: Not on file  . Years of education: Not on file  . Highest education level: Not on file  Occupational History  . Not on file  Tobacco Use  . Smoking status: Never Smoker  . Smokeless tobacco: Never Used  Vaping Use  . Vaping Use: Never used  Substance and Sexual Activity  . Alcohol use: No  . Drug use: No  . Sexual activity: Not Currently  Other Topics Concern  . Not on file  Social History Narrative   Divorced since 2000.Son lives with him.Retired ,used to work in Publishing rights manager.Ex-military.   Social Determinants of Health   Financial Resource Strain: Not on file  Food Insecurity: Not on file  Transportation Needs: Not on file  Physical Activity: Not on file  Stress: Not on file  Social Connections: Not on file  Intimate Partner Violence: Not on file      ROS:  General: Negative for anorexia, weight loss, fever,  chills, fatigue, weakness. Eyes: Negative for vision changes.  ENT: Negative for hoarseness, difficulty swallowing , nasal congestion. CV: Negative for chest pain, angina, palpitations, dyspnea on exertion, peripheral edema.  Respiratory: Negative for dyspnea at rest, dyspnea on exertion, cough, sputum, wheezing.  GI: See history of present illness. GU:  Negative for dysuria, hematuria, urinary incontinence, urinary frequency, nocturnal urination.  MS: Positive for joint pain, especially his knees.  No low back pain.  Derm: Negative for rash or itching.  Neuro: Negative for weakness, abnormal sensation, seizure, frequent headaches, memory loss, confusion.  Psych: Negative for anxiety, depression, suicidal ideation, hallucinations.  Endo: Negative for unusual weight change.  Heme: Negative for bruising or bleeding. Allergy:  Negative for rash or hives.    Physical Examination:  BP 124/76   Pulse 62   Temp 98.2 F (36.8 C) (Temporal)   Ht 5\' 5"  (1.651 m)   Wt 170 lb 6.4 oz (77.3 kg)   BMI 28.36 kg/m    General: Well-nourished, well-developed in no acute distress.  Head: Normocephalic, atraumatic.   Eyes: Conjunctiva pink, no icterus. Mouth: not performed Neck: Supple without thyromegaly, masses, or lymphadenopathy.  Lungs: Clear to auscultation bilaterally.  Heart: Regular rate and rhythm, no murmurs rubs or gallops.  Abdomen: Bowel sounds are normal, nontender, nondistended, no hepatosplenomegaly or masses, no abdominal bruits or    hernia , no rebound or guarding.   Rectal: not performed Extremities: No lower extremity edema. No clubbing or deformities.  Neuro: Alert and oriented x 4 , grossly normal neurologically.  Skin: Warm and dry, no rash or jaundice.   Psych: Alert and cooperative, normal mood and affect.  Labs: Lab Results  Component Value Date   CREATININE 1.11 07/02/2020   BUN 16 07/02/2020   NA 137 07/02/2020   K 4.8 07/02/2020   CL 104 07/02/2020   CO2 27 07/02/2020   Lab Results  Component Value Date   ALT 32 07/02/2020   AST 19 07/02/2020   ALKPHOS 47 01/12/2017   BILITOT 0.3 07/02/2020   Lab Results  Component Value Date   WBC 6.4 07/02/2020   HGB 15.7 07/02/2020   HCT 46.2 07/02/2020   MCV 86.7 07/02/2020   PLT 227 07/02/2020   Lab Results  Component Value Date   PSA 1.4 01/12/2017      Imaging Studies: DG Knee 4 Views W/Patella Left  Result Date: 07/11/2020 X-ray of the left knee demonstrates moderate degenerative changes with loss of joint space within the medial and lateral compartments.  Some osteophytes are appreciated within the patellofemoral compartment.  Impression: Moderate left knee arthritis  DG Knee 4 Views W/Patella Right  Result Date: 07/11/2020 X-ray of the right knee demonstrates advanced degenerative changes with bone-on-bone  articulations within the medial lateral compartments.  There are significant osteophytes within the patellofemoral compartment with almost complete loss of joint space.  Impression: Severe right knee arthritis   Assessment:  75 year old male with history of colon polyps on remote colonoscopy, perianal itching/rectal bleeding presenting for further evaluation.  I suspect his symptoms are due to benign anorectal source such as hemorrhoids however warrants evaluation via colonoscopy since his last one was more than 10 years ago and he has a history of colon polyps.  If his colonoscopy is unremarkable, he may be a candidate for hemorrhoid banding in the office.  Discussed with patient briefly today.  Plan:  1. Colonoscopy in the near future with Dr. 66. ASA II.  I have discussed the risks,  alternatives, benefits with regards to but not limited to the risk of reaction to medication, bleeding, infection, perforation and the patient is agreeable to proceed. Written consent to be obtained. 2. Preparation H anorectal he twice daily for 2 weeks. 3. Continue Metamucil daily. 

## 2020-08-14 ENCOUNTER — Telehealth: Payer: Self-pay | Admitting: *Deleted

## 2020-08-14 ENCOUNTER — Encounter (HOSPITAL_COMMUNITY)
Admission: RE | Admit: 2020-08-14 | Discharge: 2020-08-14 | Disposition: A | Discharge status: Home / Self Care | Payer: Medicare Other | Source: Ambulatory Visit | Attending: Internal Medicine | Admitting: Internal Medicine

## 2020-08-14 ENCOUNTER — Other Ambulatory Visit: Payer: Self-pay

## 2020-08-14 ENCOUNTER — Telehealth: Payer: Self-pay | Admitting: Internal Medicine

## 2020-08-14 DIAGNOSIS — Z20822 Contact with and (suspected) exposure to covid-19: Secondary | ICD-10-CM | POA: Insufficient documentation

## 2020-08-14 DIAGNOSIS — Z01812 Encounter for preprocedural laboratory examination: Secondary | ICD-10-CM | POA: Diagnosis not present

## 2020-08-14 LAB — SARS CORONAVIRUS 2 (TAT 6-24 HRS): SARS Coronavirus 2: NEGATIVE

## 2020-08-14 NOTE — Telephone Encounter (Signed)
Patient returned call. He states his daughter is bringing him and he is not able to come in any sooner. Called endo and LMOVM to make aware

## 2020-08-14 NOTE — Telephone Encounter (Signed)
LMOVM for pt to call back to see if he can move procedure time up on Thursday with Dr. Marletta Lor

## 2020-08-14 NOTE — Telephone Encounter (Signed)
PATIENT RETURNED CALL, PLEASE CALL BACK  °

## 2020-08-14 NOTE — Telephone Encounter (Signed)
Duplicate message, see prior note

## 2020-08-16 ENCOUNTER — Other Ambulatory Visit: Payer: Self-pay

## 2020-08-16 ENCOUNTER — Encounter (HOSPITAL_COMMUNITY)
Admission: RE | Disposition: A | Discharge status: Home / Self Care | Payer: Self-pay | Source: Home / Self Care | Attending: Internal Medicine

## 2020-08-16 ENCOUNTER — Ambulatory Visit (HOSPITAL_COMMUNITY): Payer: Medicare Other | Admitting: Certified Registered Nurse Anesthetist

## 2020-08-16 ENCOUNTER — Ambulatory Visit (HOSPITAL_COMMUNITY)
Admission: RE | Admit: 2020-08-16 | Discharge: 2020-08-16 | Disposition: A | Discharge status: Home / Self Care | Payer: Medicare Other | Attending: Internal Medicine | Admitting: Internal Medicine

## 2020-08-16 ENCOUNTER — Encounter (HOSPITAL_COMMUNITY): Payer: Self-pay

## 2020-08-16 DIAGNOSIS — K5730 Diverticulosis of large intestine without perforation or abscess without bleeding: Secondary | ICD-10-CM | POA: Insufficient documentation

## 2020-08-16 DIAGNOSIS — K921 Melena: Secondary | ICD-10-CM | POA: Diagnosis not present

## 2020-08-16 DIAGNOSIS — L290 Pruritus ani: Secondary | ICD-10-CM | POA: Insufficient documentation

## 2020-08-16 DIAGNOSIS — Z951 Presence of aortocoronary bypass graft: Secondary | ICD-10-CM | POA: Insufficient documentation

## 2020-08-16 DIAGNOSIS — K648 Other hemorrhoids: Secondary | ICD-10-CM | POA: Diagnosis not present

## 2020-08-16 DIAGNOSIS — Z7982 Long term (current) use of aspirin: Secondary | ICD-10-CM | POA: Insufficient documentation

## 2020-08-16 DIAGNOSIS — I2510 Atherosclerotic heart disease of native coronary artery without angina pectoris: Secondary | ICD-10-CM | POA: Diagnosis not present

## 2020-08-16 DIAGNOSIS — K635 Polyp of colon: Secondary | ICD-10-CM | POA: Diagnosis not present

## 2020-08-16 DIAGNOSIS — Z86010 Personal history of colonic polyps: Secondary | ICD-10-CM | POA: Insufficient documentation

## 2020-08-16 DIAGNOSIS — K625 Hemorrhage of anus and rectum: Secondary | ICD-10-CM | POA: Diagnosis not present

## 2020-08-16 DIAGNOSIS — Z79899 Other long term (current) drug therapy: Secondary | ICD-10-CM | POA: Insufficient documentation

## 2020-08-16 HISTORY — PX: COLONOSCOPY WITH PROPOFOL: SHX5780

## 2020-08-16 SURGERY — COLONOSCOPY WITH PROPOFOL
Anesthesia: General

## 2020-08-16 MED ORDER — PROPOFOL 500 MG/50ML IV EMUL
INTRAVENOUS | Status: DC | PRN
  Administered 2020-08-16: 150 ug/kg/min via INTRAVENOUS

## 2020-08-16 MED ORDER — LACTATED RINGERS IV SOLN
INTRAVENOUS | Status: DC
  Administered 2020-08-16: 1000 mL via INTRAVENOUS

## 2020-08-16 MED ORDER — EPHEDRINE SULFATE 50 MG/ML IJ SOLN
INTRAMUSCULAR | Status: DC | PRN
  Administered 2020-08-16 (×2): 10 mg via INTRAVENOUS

## 2020-08-16 MED ORDER — STERILE WATER FOR IRRIGATION IR SOLN
Status: DC | PRN
  Administered 2020-08-16: 100 mL

## 2020-08-16 MED ORDER — PROPOFOL 10 MG/ML IV BOLUS
INTRAVENOUS | Status: DC | PRN
  Administered 2020-08-16: 80 mg via INTRAVENOUS

## 2020-08-16 NOTE — Interval H&P Note (Signed)
History and Physical Interval Note:  08/16/2020 9:02 AM  Corey Glover  has presented today for surgery, with the diagnosis of rectal bleeding, history of colon polyps.  The various methods of treatment have been discussed with the patient and family. After consideration of risks, benefits and other options for treatment, the patient has consented to  Procedure(s) with comments: COLONOSCOPY WITH PROPOFOL (N/A) - 10:15am, per office - pt not able to come earlier due to transportation as a surgical intervention.  The patient's history has been reviewed, patient examined, no change in status, stable for surgery.  I have reviewed the patient's chart and labs.  Questions were answered to the patient's satisfaction.     Lanelle Bal

## 2020-08-16 NOTE — Anesthesia Preprocedure Evaluation (Signed)
Anesthesia Evaluation  Patient identified by MRN, date of birth, ID band Patient awake    Reviewed: Allergy & Precautions, H&P , NPO status , Patient's Chart, lab work & pertinent test results, reviewed documented beta blocker date and time   Airway Mallampati: II  TM Distance: >3 FB Neck ROM: full    Dental no notable dental hx.    Pulmonary neg pulmonary ROS,    Pulmonary exam normal breath sounds clear to auscultation       Cardiovascular Exercise Tolerance: Good + CAD and + CABG   Rhythm:regular Rate:Normal     Neuro/Psych negative neurological ROS  negative psych ROS   GI/Hepatic negative GI ROS, Neg liver ROS,   Endo/Other  negative endocrine ROS  Renal/GU negative Renal ROS  negative genitourinary   Musculoskeletal   Abdominal   Peds  Hematology negative hematology ROS (+)   Anesthesia Other Findings   Reproductive/Obstetrics negative OB ROS                             Anesthesia Physical Anesthesia Plan  ASA: III  Anesthesia Plan: General   Post-op Pain Management:    Induction:   PONV Risk Score and Plan: Propofol infusion  Airway Management Planned:   Additional Equipment:   Intra-op Plan:   Post-operative Plan:   Informed Consent: I have reviewed the patients History and Physical, chart, labs and discussed the procedure including the risks, benefits and alternatives for the proposed anesthesia with the patient or authorized representative who has indicated his/her understanding and acceptance.     Dental Advisory Given  Plan Discussed with: CRNA  Anesthesia Plan Comments:         Anesthesia Quick Evaluation

## 2020-08-16 NOTE — Transfer of Care (Signed)
Immediate Anesthesia Transfer of Care Note  Patient: Corey Glover  Procedure(s) Performed: COLONOSCOPY WITH PROPOFOL (N/A )  Patient Location: PACU  Anesthesia Type:General  Level of Consciousness: awake and alert   Airway & Oxygen Therapy: Patient Spontanous Breathing  Post-op Assessment: Report given to RN and Post -op Vital signs reviewed and stable  Post vital signs: Reviewed and stable  Last Vitals:  Vitals Value Taken Time  BP    Temp    Pulse    Resp    SpO2      Last Pain:  Vitals:   08/16/20 0957  TempSrc:   PainSc: 0-No pain      Patients Stated Pain Goal: 8 (08/16/20 0908)  Complications: No complications documented.

## 2020-08-16 NOTE — Anesthesia Postprocedure Evaluation (Signed)
Anesthesia Post Note  Patient: Corey Glover  Procedure(s) Performed: COLONOSCOPY WITH PROPOFOL (N/A )  Patient location during evaluation: Phase II Anesthesia Type: General Level of consciousness: awake and alert Pain management: satisfactory to patient Respiratory status: spontaneous breathing and respiratory function stable Cardiovascular status: blood pressure returned to baseline and stable Postop Assessment: no apparent nausea or vomiting Anesthetic complications: no   No complications documented.   Last Vitals:  Vitals:   08/16/20 0908  BP: 134/73  Pulse: 70  Resp: 13  Temp: 36.5 C  SpO2: 95%    Last Pain:  Vitals:   08/16/20 0957  TempSrc:   PainSc: 0-No pain                 Lorin Glass

## 2020-08-16 NOTE — Discharge Instructions (Addendum)
  Colonoscopy Discharge Instructions  Read the instructions outlined below and refer to this sheet in the next few weeks. These discharge instructions provide you with general information on caring for yourself after you leave the hospital. Your doctor may also give you specific instructions. While your treatment has been planned according to the most current medical practices available, unavoidable complications occasionally occur.   ACTIVITY  You may resume your regular activity, but move at a slower pace for the next 24 hours.   Take frequent rest periods for the next 24 hours.   Walking will help get rid of the air and reduce the bloated feeling in your belly (abdomen).   No driving for 24 hours (because of the medicine (anesthesia) used during the test).    Do not sign any important legal documents or operate any machinery for 24 hours (because of the anesthesia used during the test).  NUTRITION  Drink plenty of fluids.   You may resume your normal diet as instructed by your doctor.   Begin with a light meal and progress to your normal diet. Heavy or fried foods are harder to digest and may make you feel sick to your stomach (nauseated).   Avoid alcoholic beverages for 24 hours or as instructed.  MEDICATIONS  You may resume your normal medications unless your doctor tells you otherwise.  WHAT YOU CAN EXPECT TODAY  Some feelings of bloating in the abdomen.   Passage of more gas than usual.   Spotting of blood in your stool or on the toilet paper.  IF YOU HAD POLYPS REMOVED DURING THE COLONOSCOPY:  No aspirin products for 7 days or as instructed.   No alcohol for 7 days or as instructed.   Eat a soft diet for the next 24 hours.  FINDING OUT THE RESULTS OF YOUR TEST Not all test results are available during your visit. If your test results are not back during the visit, make an appointment with your caregiver to find out the results. Do not assume everything is normal if  you have not heard from your caregiver or the medical facility. It is important for you to follow up on all of your test results.  SEEK IMMEDIATE MEDICAL ATTENTION IF:  You have more than a spotting of blood in your stool.   Your belly is swollen (abdominal distention).   You are nauseated or vomiting.   You have a temperature over 101.   You have abdominal pain or discomfort that is severe or gets worse throughout the day.   Your colonoscopy w as relatively unremarkable.  I did not find any colon polyps or evidence of colon cancer. You do have diverticulosis and internal hemorrhoids. I would recommend increasing fiber in your diet or adding OTC Benefiber/Metamucil. Be sure to drink at least 4 to 6 glasses of water daily. Follow-up with GI in 3 months for hemorrhoid banding if still having bleeding.   I hope you have a great rest of your week!  Hennie Duos. Marletta Lor, D.O. Gastroenterology and Hepatology [facility]

## 2020-08-16 NOTE — Op Note (Signed)
[facility] Patient Name: Corey Glover Procedure Date: 08/16/2020 9:38 AM MRN: [id number] Date of Birth: Sep 29, 1945 Attending MD: Elon Alas. Abbey Chatters DO CSN: [id number] Age: 75 Admit Type: Outpatient Procedure:                Colonoscopy Indications:              Rectal bleeding Providers:                Elon Alas. Abbey Chatters, DO, Tammy Vaught, RN, Nelma Rothman,                            Technician Referring MD:              Medicines:                See the Anesthesia note for documentation of the                            administered medications Complications:            No immediate complications. Estimated Blood Loss:     Estimated blood loss: none. Procedure:                Pre-Anesthesia Assessment:                           - The anesthesia plan was to use monitored                            anesthesia care (MAC).                           After obtaining informed consent, the colonoscope                            was passed under direct vision. Throughout the                            procedure, the patient's blood pressure, pulse, and                            oxygen saturations were monitored continuously. The                            [id number]-HQ190L ([id number]) scope was introduced through                            the anus and advanced to the the cecum, identified                            by appendiceal orifice and ileocecal valve. The                            colonoscopy was performed without difficulty. The                            patient tolerated the procedure well. The quality  of the bowel preparation was evaluated using the                            BBPS [facility] Bowel Preparation Scale) with scores                            of: Right Colon = 3, Transverse Colon = 3 and Left                            Colon = 3 (entire mucosa seen well with no residual                            staining, small fragments of stool or opaque                             liquid). The total BBPS score equals 9. Scope In: 10:01:13 AM Scope Out: 10:11:38 AM Scope Withdrawal Time: 0 hours 6 minutes 23 seconds  Total Procedure Duration: 0 hours 10 minutes 25 seconds  Findings:      The perianal and digital rectal examinations were normal.      Non-bleeding internal hemorrhoids were found during retroflexion.      Multiple small and large-mouthed diverticula were found in the sigmoid       colon and descending colon.      The exam was otherwise without abnormality. Impression:               - Non-bleeding internal hemorrhoids.                           - Diverticulosis in the sigmoid colon and in the                            descending colon.                           - The examination was otherwise normal.                           - No specimens collected. Moderate Sedation:      Per Anesthesia Care Recommendation:           - Patient has a contact number available for                            emergencies. The signs and symptoms of potential                            delayed complications were discussed with the                            patient. Return to normal activities tomorrow.                            Written discharge instructions were provided to the  patient.                           - Resume previous diet.                           - Continue present medications.                           - Repeat colonoscopy in 10 years for screening                            purposes.                           - Return to GI clinic in 3 months with Roseanne Kaufman                            for hemorrhoid banding is still having bleeding. Procedure Code(s):        --- Professional ---                           [id number], Colonoscopy, flexible; diagnostic, including                            collection of specimen(s) by brushing or washing,                            when performed (separate  procedure) Diagnosis Code(s):        --- Professional ---                           K64.8, Other hemorrhoids                           K62.5, Hemorrhage of anus and rectum                           K57.30, Diverticulosis of large intestine without                            perforation or abscess without bleeding CPT copyright 2019 American Medical Association. All rights reserved. The codes documented in this report are preliminary and upon coder review may  be revised to meet current compliance requirements. Elon Alas. Abbey Chatters, DO [place] Abbey Chatters, DO 08/16/2020 10:14:28 AM This report has been signed electronically. Number of Addenda: 0

## 2020-08-21 ENCOUNTER — Encounter (HOSPITAL_COMMUNITY): Payer: Self-pay | Admitting: Internal Medicine

## 2020-10-04 ENCOUNTER — Ambulatory Visit (INDEPENDENT_AMBULATORY_CARE_PROVIDER_SITE_OTHER): Payer: Medicare Other | Admitting: Nurse Practitioner

## 2020-10-04 ENCOUNTER — Encounter (INDEPENDENT_AMBULATORY_CARE_PROVIDER_SITE_OTHER): Payer: Self-pay | Admitting: Nurse Practitioner

## 2020-10-16 ENCOUNTER — Encounter: Payer: Medicare Other | Admitting: Gastroenterology

## 2020-11-06 ENCOUNTER — Ambulatory Visit (INDEPENDENT_AMBULATORY_CARE_PROVIDER_SITE_OTHER): Payer: Medicare Other | Admitting: Nurse Practitioner

## 2020-11-21 ENCOUNTER — Other Ambulatory Visit: Payer: Self-pay

## 2020-11-21 ENCOUNTER — Ambulatory Visit (INDEPENDENT_AMBULATORY_CARE_PROVIDER_SITE_OTHER): Payer: Medicare Other | Admitting: Nurse Practitioner

## 2020-11-21 ENCOUNTER — Encounter (INDEPENDENT_AMBULATORY_CARE_PROVIDER_SITE_OTHER): Payer: Self-pay | Admitting: Nurse Practitioner

## 2020-11-21 VITALS — BP 122/66 | HR 61 | Temp 97.5°F | Ht 63.0 in | Wt 167.0 lb

## 2020-11-21 DIAGNOSIS — I2510 Atherosclerotic heart disease of native coronary artery without angina pectoris: Secondary | ICD-10-CM | POA: Diagnosis not present

## 2020-11-21 DIAGNOSIS — M545 Low back pain, unspecified: Secondary | ICD-10-CM | POA: Diagnosis not present

## 2020-11-21 DIAGNOSIS — M25562 Pain in left knee: Secondary | ICD-10-CM | POA: Diagnosis not present

## 2020-11-21 DIAGNOSIS — G8929 Other chronic pain: Secondary | ICD-10-CM

## 2020-11-21 DIAGNOSIS — K625 Hemorrhage of anus and rectum: Secondary | ICD-10-CM

## 2020-11-21 NOTE — Progress Notes (Signed)
Subjective:  Patient ID: Corey Glover, male    DOB: 01-Jul-1945  Age: 75 y.o. MRN: [id number]  CC:  Chief Complaint  Patient presents with  . Follow-up    Doing okay, has been farming a little  . Coronary Artery Disease  . Knee Pain  . Other    Rectal bleed      HPI  This patient arrives today for the above.  CAD: Had coronary artery bypass graft 1997.  Since then he tells me he has experienced any chest pain but does experience some heartburn intermittently.  He continues on aspirin, Zetia, metoprolol, and pravastatin.  Last LDL was collected about 5 months ago was 84.  He has been working in the yard more regularly he says the weather has gotten warmer and denies any increasing chest pain or heartburn with his increased activity.  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  Left knee pain: He is being followed by orthopedics, he did undergo steroid injection and tells me it has not improved his pain much.  He continues to use over-the-counter ointment as well as with Voltaren gel with moderate improvement in the pain.  Rectal bleeding: He underwent colonoscopy about 3 months ago, no significant abnormalities were found to explain the bleeding. Low back pain: He tells me he has been having some intermittent low back pain that will radiate up towards his mid back.  He denies any dysuria or fevers.  He tells me last time he felt the pain was a few days ago.  It does not seem to be impacting him very much as far as his functioning is concerned.  Past Medical History:  Diagnosis Date  . BPH (benign prostatic hyperplasia)   . CAD in native artery   . Hyperlipidemia       Family History  Problem Relation Age of Onset  . Heart disease Father   . Heart disease Brother   . Colon cancer Neg Hx     Social History   Social History Narrative   Divorced since 2000.Son lives with  him.Retired ,used to work in Publishing rights manager.Ex-military.   Social History   Tobacco Use  . Smoking status: Never Smoker  . Smokeless tobacco: Never Used  Substance Use Topics  . Alcohol use: No     Current Meds  Medication Sig  . aspirin EC 81 MG tablet Take 81 mg by mouth daily.  . cholecalciferol (VITAMIN D3) 25 MCG (1000 UNIT) tablet Take 1,000 Units by mouth daily.  . diclofenac Sodium (VOLTAREN) 1 % GEL Apply topically as needed.  . ezetimibe (ZETIA) 10 MG tablet Take 5 mg by mouth daily.  . metoprolol succinate (TOPROL-XL) 25 MG 24 hr tablet Take 12.5 mg by mouth daily.  . pravastatin (PRAVACHOL) 40 MG tablet Take 40 mg by mouth daily.  . tamsulosin (FLOMAX) 0.4 MG CAPS capsule Take 0.4 mg by mouth daily.    ROS:  Review of Systems  Constitutional: Negative for malaise/fatigue.  Respiratory: Positive for shortness of breath (intermittently).   Cardiovascular: Positive for claudication and leg swelling. Negative for chest pain, palpitations and orthopnea.  Gastrointestinal: Positive for heartburn.     Objective:   Today's Vitals: BP 122/66   Pulse 61   Temp (!) 97.5 F (36.4 C) (Temporal)   Ht 5\' 3"  (1.6 m)   Wt 167 lb (75.8 kg)  SpO2 97%   BMI 29.58 kg/m  Vitals with BMI 11/21/2020 08/16/2020 08/16/2020  Height 5\' 3"  - 5\' 5"   Weight 167 lbs - 170 lbs  BMI 29.59 - 28.29  Systolic 122 112  Diastolic 66 63 73  Pulse 61 71 70     Physical Exam Vitals reviewed.  Constitutional:      Appearance: Normal appearance.  HENT:     Head: Normocephalic and atraumatic.  Cardiovascular:     Rate and Rhythm: Normal rate and regular rhythm.  Pulmonary:     Effort: Pulmonary effort is normal.     Breath sounds: Normal breath sounds.  Musculoskeletal:     Cervical back: Neck supple.  Skin:    General: Skin is warm and dry.  Neurological:     Mental Status: He is alert and oriented to person, place, and time.  Psychiatric:        Mood and Affect: Mood  normal.        Behavior: Behavior normal.        Thought Content: Thought content normal.        Judgment: Judgment normal.          Assessment and Plan   1. Acute low back pain without sciatica, unspecified back pain laterality   2. Rectal bleeding   3. CAD in native artery   4. Chronic pain of left knee      Plan: 1.  Very well could be related to increased activity level and musculoskeletal nature.  However we will collect urine sample to rule out UTI.  Further recommendations be made based upon the results. 2.  No further recommendations regarding further work-up recommended at this time.  It does not appear that he is having further bleeding episodes currently.  If this occurs again may consider drug holiday or stopping aspirin completely. 3.  Appears stable at this time.  He will continue on medication as currently prescribed.  I did discuss going to see a cardiologist at least on a yearly basis, but he prefers not to undergo evaluation with cardiologist at this time.  I did tell him if his indigestion worsens or he starts to experience chest pain he is to notify me right away/go to the emergency department if this pain is significant.  He tells me he understands. 4.  He was encouraged to follow-up with orthopedist as scheduled.    Tests ordered Orders Placed This Encounter  Procedures  . Urinalysis with Culture Reflex      No orders of the defined types were placed in this encounter.   Patient to follow-up in 3 months or sooner as needed.  , NP

## 2020-11-22 LAB — URINALYSIS W MICROSCOPIC + REFLEX CULTURE
Bacteria, UA: NONE SEEN /HPF
Bilirubin Urine: NEGATIVE
Glucose, UA: NEGATIVE
Hgb urine dipstick: NEGATIVE
Hyaline Cast: NONE SEEN /LPF
Ketones, ur: NEGATIVE
Leukocyte Esterase: NEGATIVE
Nitrites, Initial: NEGATIVE
Protein, ur: NEGATIVE
RBC / HPF: NONE SEEN /HPF (ref 0–2)
Specific Gravity, Urine: 1.026 (ref 1.001–1.035)
Squamous Epithelial / HPF: NONE SEEN /HPF (ref <=5)
WBC, UA: NONE SEEN /HPF (ref 0–5)
pH: 5.5 (ref 5.0–8.0)

## 2020-11-22 LAB — NO CULTURE INDICATED

## 2021-01-03 ENCOUNTER — Ambulatory Visit (INDEPENDENT_AMBULATORY_CARE_PROVIDER_SITE_OTHER): Payer: Medicare Other | Admitting: Nurse Practitioner

## 2021-01-22 DIAGNOSIS — M25561 Pain in right knee: Secondary | ICD-10-CM | POA: Diagnosis not present

## 2021-01-22 DIAGNOSIS — M1712 Unilateral primary osteoarthritis, left knee: Secondary | ICD-10-CM | POA: Diagnosis not present

## 2021-01-22 DIAGNOSIS — M25562 Pain in left knee: Secondary | ICD-10-CM | POA: Diagnosis not present

## 2021-01-22 DIAGNOSIS — M1711 Unilateral primary osteoarthritis, right knee: Secondary | ICD-10-CM | POA: Diagnosis not present

## 2021-01-23 ENCOUNTER — Ambulatory Visit (INDEPENDENT_AMBULATORY_CARE_PROVIDER_SITE_OTHER): Payer: Medicare Other | Admitting: Nurse Practitioner

## 2021-03-09 DIAGNOSIS — Z0189 Encounter for other specified special examinations: Secondary | ICD-10-CM | POA: Diagnosis not present

## 2021-03-09 DIAGNOSIS — E785 Hyperlipidemia, unspecified: Secondary | ICD-10-CM | POA: Insufficient documentation

## 2021-03-09 DIAGNOSIS — N400 Enlarged prostate without lower urinary tract symptoms: Secondary | ICD-10-CM | POA: Insufficient documentation

## 2021-03-09 DIAGNOSIS — I10 Essential (primary) hypertension: Secondary | ICD-10-CM | POA: Diagnosis not present

## 2021-03-09 DIAGNOSIS — I25810 Atherosclerosis of coronary artery bypass graft(s) without angina pectoris: Secondary | ICD-10-CM | POA: Diagnosis not present

## 2021-03-09 DIAGNOSIS — E782 Mixed hyperlipidemia: Secondary | ICD-10-CM | POA: Diagnosis not present

## 2021-03-09 DIAGNOSIS — E559 Vitamin D deficiency, unspecified: Secondary | ICD-10-CM | POA: Insufficient documentation

## 2021-03-13 ENCOUNTER — Ambulatory Visit (INDEPENDENT_AMBULATORY_CARE_PROVIDER_SITE_OTHER): Payer: Medicare Other | Admitting: Nurse Practitioner

## 2021-03-29 DIAGNOSIS — R7301 Impaired fasting glucose: Secondary | ICD-10-CM | POA: Diagnosis not present

## 2021-03-29 DIAGNOSIS — E782 Mixed hyperlipidemia: Secondary | ICD-10-CM | POA: Diagnosis not present

## 2021-03-29 DIAGNOSIS — E559 Vitamin D deficiency, unspecified: Secondary | ICD-10-CM | POA: Diagnosis not present

## 2021-03-29 DIAGNOSIS — I2510 Atherosclerotic heart disease of native coronary artery without angina pectoris: Secondary | ICD-10-CM | POA: Diagnosis not present

## 2021-03-29 DIAGNOSIS — R42 Dizziness and giddiness: Secondary | ICD-10-CM | POA: Diagnosis not present

## 2021-03-29 DIAGNOSIS — R0609 Other forms of dyspnea: Secondary | ICD-10-CM | POA: Diagnosis not present

## 2021-03-31 DIAGNOSIS — I2510 Atherosclerotic heart disease of native coronary artery without angina pectoris: Secondary | ICD-10-CM | POA: Insufficient documentation

## 2021-03-31 DIAGNOSIS — R42 Dizziness and giddiness: Secondary | ICD-10-CM | POA: Insufficient documentation

## 2021-03-31 DIAGNOSIS — R0609 Other forms of dyspnea: Secondary | ICD-10-CM | POA: Insufficient documentation

## 2021-03-31 DIAGNOSIS — R7301 Impaired fasting glucose: Secondary | ICD-10-CM | POA: Insufficient documentation

## 2021-04-25 DIAGNOSIS — R7303 Prediabetes: Secondary | ICD-10-CM | POA: Insufficient documentation

## 2021-04-25 DIAGNOSIS — M179 Osteoarthritis of knee, unspecified: Secondary | ICD-10-CM | POA: Insufficient documentation

## 2021-07-26 DIAGNOSIS — E559 Vitamin D deficiency, unspecified: Secondary | ICD-10-CM | POA: Diagnosis not present

## 2021-07-26 DIAGNOSIS — E782 Mixed hyperlipidemia: Secondary | ICD-10-CM | POA: Diagnosis not present

## 2021-07-26 DIAGNOSIS — R42 Dizziness and giddiness: Secondary | ICD-10-CM | POA: Diagnosis not present

## 2021-07-26 DIAGNOSIS — R7303 Prediabetes: Secondary | ICD-10-CM | POA: Diagnosis not present

## 2021-07-31 DIAGNOSIS — E039 Hypothyroidism, unspecified: Secondary | ICD-10-CM | POA: Insufficient documentation

## 2021-08-01 DIAGNOSIS — I2510 Atherosclerotic heart disease of native coronary artery without angina pectoris: Secondary | ICD-10-CM | POA: Diagnosis not present

## 2021-08-01 DIAGNOSIS — E039 Hypothyroidism, unspecified: Secondary | ICD-10-CM | POA: Diagnosis not present

## 2021-08-01 DIAGNOSIS — R42 Dizziness and giddiness: Secondary | ICD-10-CM | POA: Diagnosis not present

## 2021-08-01 DIAGNOSIS — E782 Mixed hyperlipidemia: Secondary | ICD-10-CM | POA: Diagnosis not present

## 2021-08-01 DIAGNOSIS — R0609 Other forms of dyspnea: Secondary | ICD-10-CM | POA: Diagnosis not present

## 2021-08-01 DIAGNOSIS — I25810 Atherosclerosis of coronary artery bypass graft(s) without angina pectoris: Secondary | ICD-10-CM | POA: Diagnosis not present

## 2021-08-01 DIAGNOSIS — E559 Vitamin D deficiency, unspecified: Secondary | ICD-10-CM | POA: Diagnosis not present

## 2021-08-01 DIAGNOSIS — R7303 Prediabetes: Secondary | ICD-10-CM | POA: Diagnosis not present

## 2021-08-01 DIAGNOSIS — I10 Essential (primary) hypertension: Secondary | ICD-10-CM | POA: Diagnosis not present

## 2021-08-01 DIAGNOSIS — M179 Osteoarthritis of knee, unspecified: Secondary | ICD-10-CM | POA: Diagnosis not present

## 2021-08-07 ENCOUNTER — Other Ambulatory Visit: Payer: Self-pay

## 2021-08-07 ENCOUNTER — Emergency Department (HOSPITAL_COMMUNITY): Payer: Medicare Other

## 2021-08-07 ENCOUNTER — Emergency Department (HOSPITAL_COMMUNITY)
Admission: EM | Admit: 2021-08-07 | Discharge: 2021-08-08 | Disposition: A | Discharge status: Home / Self Care | Payer: Medicare Other | Attending: Emergency Medicine | Admitting: Emergency Medicine

## 2021-08-07 DIAGNOSIS — Z951 Presence of aortocoronary bypass graft: Secondary | ICD-10-CM | POA: Diagnosis not present

## 2021-08-07 DIAGNOSIS — Z7982 Long term (current) use of aspirin: Secondary | ICD-10-CM | POA: Diagnosis not present

## 2021-08-07 DIAGNOSIS — I2510 Atherosclerotic heart disease of native coronary artery without angina pectoris: Secondary | ICD-10-CM | POA: Insufficient documentation

## 2021-08-07 DIAGNOSIS — R9431 Abnormal electrocardiogram [ECG] [EKG]: Secondary | ICD-10-CM | POA: Diagnosis not present

## 2021-08-07 DIAGNOSIS — S161XXA Strain of muscle, fascia and tendon at neck level, initial encounter: Secondary | ICD-10-CM | POA: Diagnosis not present

## 2021-08-07 DIAGNOSIS — M542 Cervicalgia: Secondary | ICD-10-CM | POA: Diagnosis not present

## 2021-08-07 DIAGNOSIS — M25519 Pain in unspecified shoulder: Secondary | ICD-10-CM | POA: Insufficient documentation

## 2021-08-07 DIAGNOSIS — J309 Allergic rhinitis, unspecified: Secondary | ICD-10-CM | POA: Insufficient documentation

## 2021-08-07 DIAGNOSIS — M5030 Other cervical disc degeneration, unspecified cervical region: Secondary | ICD-10-CM | POA: Diagnosis not present

## 2021-08-07 DIAGNOSIS — IMO0001 Reserved for inherently not codable concepts without codable children: Secondary | ICD-10-CM | POA: Insufficient documentation

## 2021-08-07 DIAGNOSIS — R079 Chest pain, unspecified: Secondary | ICD-10-CM | POA: Diagnosis not present

## 2021-08-07 DIAGNOSIS — I259 Chronic ischemic heart disease, unspecified: Secondary | ICD-10-CM | POA: Insufficient documentation

## 2021-08-07 DIAGNOSIS — M5412 Radiculopathy, cervical region: Secondary | ICD-10-CM | POA: Insufficient documentation

## 2021-08-07 DIAGNOSIS — M436 Torticollis: Secondary | ICD-10-CM

## 2021-08-07 DIAGNOSIS — K5900 Constipation, unspecified: Secondary | ICD-10-CM | POA: Insufficient documentation

## 2021-08-07 LAB — BASIC METABOLIC PANEL
Anion gap: 9 (ref 5–15)
BUN: 18 mg/dL (ref 8–23)
CO2: 21 mmol/L — ABNORMAL LOW (ref 22–32)
Calcium: 9.1 mg/dL (ref 8.9–10.3)
Chloride: 105 mmol/L (ref 98–111)
Creatinine, Ser: 1.03 mg/dL (ref 0.61–1.24)
GFR, Estimated: >60 mL/min (ref >60)
Glucose, Bld: 117 mg/dL — ABNORMAL HIGH (ref 70–99)
Potassium: 3.9 mmol/L (ref 3.5–5.1)
Sodium: 135 mmol/L (ref 135–145)

## 2021-08-07 LAB — CBC
HCT: 46.2 % (ref 39.0–52.0)
Hemoglobin: 15.7 g/dL (ref 13.0–17.0)
MCH: 29.9 pg (ref 26.0–34.0)
MCHC: 34 g/dL (ref 30.0–36.0)
MCV: 88 fL (ref 80.0–100.0)
Platelets: 243 10*3/uL (ref 150–400)
RBC: 5.25 MIL/uL (ref 4.22–5.81)
RDW: 13.2 % (ref 11.5–15.5)
WBC: 10.9 10*3/uL — ABNORMAL HIGH (ref 4.0–10.5)
nRBC: 0 % (ref 0.0–0.2)

## 2021-08-07 LAB — PROTIME-INR
INR: 1 (ref 0.8–1.2)
Prothrombin Time: 12.7 seconds (ref 11.4–15.2)

## 2021-08-07 LAB — TROPONIN I (HIGH SENSITIVITY): Troponin I (High Sensitivity): 5 ng/L (ref <18)

## 2021-08-07 MED ORDER — ACETAMINOPHEN 500 MG PO TABS
1000.0000 mg | ORAL_TABLET | Freq: Once | ORAL | Status: AC
  Administered 2021-08-07: 1000 mg via ORAL
  Filled 2021-08-07: qty 2

## 2021-08-07 MED ORDER — LIDOCAINE 5 % EX PTCH
1.0000 | MEDICATED_PATCH | CUTANEOUS | Status: DC
  Administered 2021-08-07: 1 via TRANSDERMAL
  Filled 2021-08-07: qty 1

## 2021-08-07 MED ORDER — SODIUM CHLORIDE 0.9% FLUSH: 3.0000 mL | Freq: Once | INTRAVENOUS | Status: DC

## 2021-08-07 MED ORDER — METHOCARBAMOL 750 MG PO TABS: 750.0000 mg | ORAL_TABLET | Freq: Three times a day (TID) | ORAL | 0 refills | Status: DC | PRN

## 2021-08-07 MED ORDER — METHOCARBAMOL 500 MG PO TABS
500.0000 mg | ORAL_TABLET | Freq: Once | ORAL | Status: AC
  Administered 2021-08-07: 500 mg via ORAL
  Filled 2021-08-07: qty 1

## 2021-08-07 NOTE — Discharge Instructions (Signed)
It was our pleasure to provide your ER care today - we hope that you feel better.  Try heat therapy and/or gentle massage to sore area.   Take acetaminophen or ibuprofen as need for pain. You may also take robaxin as need for pain - no driving for the next 6 hours, or when taking robaxin.  You may also try pain patches (available over the counter) to sore area.   Follow up with primary care doctor in one week if symptoms fail to improve/resolve.  Return to ER if worse, new symptoms, fevers, chest pain, trouble breathing, numbness/weakness, or other concern.

## 2021-08-07 NOTE — ED Provider Notes (Addendum)
[facility] Provider Note   CSN: [id number] Arrival date & time: 08/07/21  1941     History  Chief Complaint  Patient presents with   Neck Pain    Corey Glover is a 76 y.o. male.  Patient c/o right sided neck pain this AM. States awoke with a sharp/shooting type of pain that is worse w movements of head/neck area. Unsure if slept funny. Denies trauma, injury or fall. No radicular pain or arm pain. No headache or head pain. No skin changes, lesions, swelling or redness to area. No midline/spine pain. Denies hx ddd.   The history is provided by the patient, a relative and medical records.  Neck Pain Associated symptoms: no chest pain, no fever, no headaches, no numbness and no weakness       Home Medications Prior to Admission medications   Medication Sig Start Date End Date Taking? Authorizing Provider  aspirin EC 81 MG tablet Take 81 mg by mouth daily.    [provider]  cholecalciferol (VITAMIN D3) 25 MCG (1000 UNIT) tablet Take 1,000 Units by mouth daily.    [provider]  diclofenac Sodium (VOLTAREN) 1 % GEL Apply topically as needed.    [provider]  ezetimibe (ZETIA) 10 MG tablet Take 5 mg by mouth daily.    [provider]  metoprolol succinate (TOPROL-XL) 25 MG 24 hr tablet Take 12.5 mg by mouth daily.    [provider]  pravastatin (PRAVACHOL) 40 MG tablet Take 40 mg by mouth daily. 05/03/20   [provider]  tamsulosin (FLOMAX) 0.4 MG CAPS capsule Take 0.4 mg by mouth daily.    [provider]      Allergies    Crestor [rosuvastatin] and Simvastatin    Review of Systems   Review of Systems  Constitutional:  Negative for chills and fever.  HENT:  Negative for trouble swallowing.   Eyes:  Negative for pain, redness and visual disturbance.  Cardiovascular:  Negative for chest pain.  Gastrointestinal:  Negative for nausea and vomiting.  Musculoskeletal:  Positive for  neck pain.  Skin:  Negative for rash.  Neurological:  Negative for dizziness, syncope, weakness, light-headedness, numbness and headaches.  Hematological:  Does not bruise/bleed easily.  Psychiatric/Behavioral:  Negative for confusion.    Physical Exam Updated Vital Signs BP (!) 141/74    Pulse 62    Temp 97.9 F (36.6 C) (Oral)    Resp 20    Wt 75.8 kg    SpO2 95%    BMI 29.58 kg/m  Physical Exam Vitals and nursing note reviewed.  Constitutional:      Appearance: Normal appearance. He is well-developed.  HENT:     Head: Atraumatic.     Comments: No sinus or temporal tenderness.     Nose: Nose normal.     Mouth/Throat:     Mouth: Mucous membranes are moist.  Eyes:     General: No scleral icterus.    Conjunctiva/sclera: Conjunctivae normal.     Pupils: Pupils are equal, round, and reactive to light.  Neck:     Trachea: No tracheal deviation.     Comments: Pain right lateral/posterior neck with head/neck movements, reproducing symptoms. No nuchal rigidity. No sts. No skin changes. No midline/spine tenderness.  Cardiovascular:     Rate and Rhythm: Normal rate and regular rhythm.     Pulses: Normal pulses.     Heart sounds: Normal heart sounds. No murmur heard.  No friction rub. No gallop.  Pulmonary:     Effort: Pulmonary effort is normal. No accessory muscle usage or respiratory distress.     Breath sounds: Normal breath sounds.  Abdominal:     General: There is no distension.     Tenderness: There is no abdominal tenderness.  Genitourinary:    Comments: No cva tenderness. Musculoskeletal:        General: No swelling or tenderness.     Cervical back: Neck supple.     Right lower leg: No edema.     Left lower leg: No edema.  Skin:    General: Skin is warm and dry.     Findings: No rash.  Neurological:     Mental Status: He is alert.     Comments: Alert, speech clear. Motor fxn intact bil, stre 5/5. Sens grossly intact. Steady gait.   Psychiatric:        Mood and  Affect: Mood normal.    ED Results / Procedures / Treatments   Labs (all labs ordered are listed, but only abnormal results are displayed) Results for orders placed or performed during the hospital encounter of [id number]  Basic metabolic panel  Result Value Ref Range   Sodium 135 135 - 145 mmol/L   Potassium 3.9 3.5 - 5.1 mmol/L   Chloride 105 98 - 111 mmol/L   CO2 21 (L) 22 - 32 mmol/L   Glucose, Bld 117 (H) 70 - 99 mg/dL   BUN 18 8 - 23 mg/dL   Creatinine, Ser 1.03 0.61 - 1.24 mg/dL   Calcium 9.1 8.9 - 10.3 mg/dL   GFR, Estimated >60 >60 mL/min   Anion gap 9 5 - 15  CBC  Result Value Ref Range   WBC 10.9 (H) 4.0 - 10.5 K/uL   RBC 5.25 4.22 - 5.81 MIL/uL   Hemoglobin 15.7 13.0 - 17.0 g/dL   HCT 46.2 39.0 - 52.0 %   MCV 88.0 80.0 - 100.0 fL   MCH 29.9 26.0 - 34.0 pg   MCHC 34.0 30.0 - 36.0 g/dL   RDW 13.2 11.5 - 15.5 %   Platelets 243 150 - 400 K/uL   nRBC 0.0 0.0 - 0.2 %  Protime-INR (order if Patient is taking Coumadin / Warfarin)  Result Value Ref Range   Prothrombin Time 12.7 11.4 - 15.2 seconds   INR 1.0 0.8 - 1.2  Troponin I (High Sensitivity)  Result Value Ref Range   Troponin I (High Sensitivity) 5 <18 ng/L   DG Cervical Spine 2-3 Views  Result Date: 08/07/2021 CLINICAL DATA:  Neck pain. EXAM: CERVICAL SPINE - 2-3 VIEW COMPARISON:  None. FINDINGS: No acute fracture or subluxation of the cervical spine. There is osteopenia with multilevel degenerative changes with disc space narrowing and endplate irregularity and spurring. The visualized posterior elements and odontoid appear intact. The soft tissues are unremarkable. IMPRESSION: No acute/traumatic cervical spine pathology. Electronically Signed   By: Anner Crete M.D.   On: 08/07/2021 22:12   DG Chest Portable 1 View  Result Date: 08/07/2021 CLINICAL DATA:  Neck pain EXAM: PORTABLE CHEST 1 VIEW COMPARISON:  None. FINDINGS: Lungs are clear.  No pleural effusion or pneumothorax. The heart is normal in size.  Postsurgical changes related to prior CABG. Median sternotomy. IMPRESSION: No evidence of acute cardiopulmonary disease. Electronically Signed   By: Julian Hy M.D.   On: 08/07/2021 22:11     EKG EKG Interpretation  Date/Time:  Wednesday August 07 2021 21:01:27 EST  Ventricular Rate:  63 PR Interval:  198 QRS Duration: 80 QT Interval:  400 QTC Calculation: 409 R Axis:   8 Text Interpretation: Normal sinus rhythm No previous tracing Confirmed by Lajean Saver [phone]) on 08/07/2021 9:15:14 PM  Radiology DG Cervical Spine 2-3 Views  Result Date: 08/07/2021 CLINICAL DATA:  Neck pain. EXAM: CERVICAL SPINE - 2-3 VIEW COMPARISON:  None. FINDINGS: No acute fracture or subluxation of the cervical spine. There is osteopenia with multilevel degenerative changes with disc space narrowing and endplate irregularity and spurring. The visualized posterior elements and odontoid appear intact. The soft tissues are unremarkable. IMPRESSION: No acute/traumatic cervical spine pathology. Electronically Signed   By: Anner Crete M.D.   On: 08/07/2021 22:12   DG Chest Portable 1 View  Result Date: 08/07/2021 CLINICAL DATA:  Neck pain EXAM: PORTABLE CHEST 1 VIEW COMPARISON:  None. FINDINGS: Lungs are clear.  No pleural effusion or pneumothorax. The heart is normal in size. Postsurgical changes related to prior CABG. Median sternotomy. IMPRESSION: No evidence of acute cardiopulmonary disease. Electronically Signed   By: Julian Hy M.D.   On: 08/07/2021 22:11    Procedures Procedures    Medications Ordered in ED Medications  sodium chloride flush (NS) 0.9 % injection 3 mL (has no administration in time range)  lidocaine (LIDODERM) 5 % 1 patch (has no administration in time range)  methocarbamol (ROBAXIN) tablet 500 mg (has no administration in time range)  acetaminophen (TYLENOL) tablet 1,000 mg (has no administration in time range)    ED Course/ Medical Decision Making/ A&P                            Medical Decision Making Problems Addressed: Acute strain of neck muscle, initial encounter: acute illness or injury Torticollis, acute: acute illness or injury  Amount and/or Complexity of Data Reviewed Independent Historian:     Details: family member, hx External Data Reviewed: notes. Labs: ordered. Decision-making details documented in ED Course. Radiology: ordered and independent interpretation performed. Decision-making details documented in ED Course. ECG/medicine tests: ordered and independent interpretation performed. Decision-making details documented in ED Course.  Risk OTC drugs. Prescription drug management.   Iv ns. Continuous pulse ox and cardiac monitoring. Labs. Diff considered including atypical chest pain/acs, cervical strain, torticollis, infection.   Reviewed nursing notes and prior charts for additional history. External reports reviewed, additional hx from family.    No meds pta. Lidocaine patch. Acetaminophen po. Robaxin po.  Labs reviewed/interpreted by me - trop normal. K normal.   Cxr reviewed/interpreted by me - no pna. No fx. Mild degen changes.   Exam/symptoms felt most c/w musculoskeletal pain.   Rx for home.  Rec pcp f/u.  Return precautions provided.           Final Clinical Impression(s) / ED Diagnoses Final diagnoses:  None    Rx / DC Orders ED Discharge Orders     None           Lajean Saver, MD 08/07/21 2300

## 2021-08-07 NOTE — ED Triage Notes (Signed)
Right posterior neck pain x 2 days. Onset when pt woke up yesterday. Denies cp or sob. Denies trauma

## 2021-08-26 ENCOUNTER — Ambulatory Visit: Payer: Medicare Other | Admitting: Nurse Practitioner

## 2021-09-24 ENCOUNTER — Ambulatory Visit: Payer: Medicare Other | Admitting: Internal Medicine

## 2021-10-11 DIAGNOSIS — R42 Dizziness and giddiness: Secondary | ICD-10-CM | POA: Diagnosis not present

## 2021-10-11 DIAGNOSIS — R194 Change in bowel habit: Secondary | ICD-10-CM | POA: Diagnosis not present

## 2021-11-04 DIAGNOSIS — E559 Vitamin D deficiency, unspecified: Secondary | ICD-10-CM | POA: Diagnosis not present

## 2021-11-04 DIAGNOSIS — E782 Mixed hyperlipidemia: Secondary | ICD-10-CM | POA: Diagnosis not present

## 2021-11-04 DIAGNOSIS — R42 Dizziness and giddiness: Secondary | ICD-10-CM | POA: Diagnosis not present

## 2021-11-04 DIAGNOSIS — R7303 Prediabetes: Secondary | ICD-10-CM | POA: Diagnosis not present

## 2021-11-11 DIAGNOSIS — R7303 Prediabetes: Secondary | ICD-10-CM | POA: Diagnosis not present

## 2021-11-11 DIAGNOSIS — R0609 Other forms of dyspnea: Secondary | ICD-10-CM | POA: Diagnosis not present

## 2021-11-11 DIAGNOSIS — E039 Hypothyroidism, unspecified: Secondary | ICD-10-CM | POA: Diagnosis not present

## 2021-11-11 DIAGNOSIS — I25810 Atherosclerosis of coronary artery bypass graft(s) without angina pectoris: Secondary | ICD-10-CM | POA: Diagnosis not present

## 2021-11-11 DIAGNOSIS — I10 Essential (primary) hypertension: Secondary | ICD-10-CM | POA: Diagnosis not present

## 2021-11-11 DIAGNOSIS — M179 Osteoarthritis of knee, unspecified: Secondary | ICD-10-CM | POA: Diagnosis not present

## 2021-11-11 DIAGNOSIS — E559 Vitamin D deficiency, unspecified: Secondary | ICD-10-CM | POA: Diagnosis not present

## 2021-11-11 DIAGNOSIS — I2510 Atherosclerotic heart disease of native coronary artery without angina pectoris: Secondary | ICD-10-CM | POA: Diagnosis not present

## 2021-11-11 DIAGNOSIS — R42 Dizziness and giddiness: Secondary | ICD-10-CM | POA: Diagnosis not present

## 2021-11-11 DIAGNOSIS — E782 Mixed hyperlipidemia: Secondary | ICD-10-CM | POA: Diagnosis not present

## 2022-01-02 DIAGNOSIS — H04123 Dry eye syndrome of bilateral lacrimal glands: Secondary | ICD-10-CM | POA: Diagnosis not present

## 2022-04-30 ENCOUNTER — Emergency Department (HOSPITAL_COMMUNITY)
Admission: EM | Admit: 2022-04-30 | Discharge: 2022-04-30 | Disposition: A | Discharge status: Home / Self Care | Payer: Medicare Other | Attending: Student | Admitting: Student

## 2022-04-30 ENCOUNTER — Other Ambulatory Visit: Payer: Self-pay

## 2022-04-30 ENCOUNTER — Encounter (HOSPITAL_COMMUNITY): Payer: Self-pay

## 2022-04-30 DIAGNOSIS — Z7982 Long term (current) use of aspirin: Secondary | ICD-10-CM | POA: Diagnosis not present

## 2022-04-30 DIAGNOSIS — Z951 Presence of aortocoronary bypass graft: Secondary | ICD-10-CM | POA: Insufficient documentation

## 2022-04-30 DIAGNOSIS — Z20822 Contact with and (suspected) exposure to covid-19: Secondary | ICD-10-CM | POA: Diagnosis not present

## 2022-04-30 DIAGNOSIS — Z743 Need for continuous supervision: Secondary | ICD-10-CM | POA: Diagnosis not present

## 2022-04-30 DIAGNOSIS — R531 Weakness: Secondary | ICD-10-CM | POA: Diagnosis not present

## 2022-04-30 DIAGNOSIS — I10 Essential (primary) hypertension: Secondary | ICD-10-CM | POA: Insufficient documentation

## 2022-04-30 DIAGNOSIS — I2510 Atherosclerotic heart disease of native coronary artery without angina pectoris: Secondary | ICD-10-CM | POA: Insufficient documentation

## 2022-04-30 DIAGNOSIS — E039 Hypothyroidism, unspecified: Secondary | ICD-10-CM | POA: Insufficient documentation

## 2022-04-30 DIAGNOSIS — R0902 Hypoxemia: Secondary | ICD-10-CM | POA: Diagnosis not present

## 2022-04-30 DIAGNOSIS — D72829 Elevated white blood cell count, unspecified: Secondary | ICD-10-CM | POA: Diagnosis not present

## 2022-04-30 LAB — CBC WITH DIFFERENTIAL/PLATELET
Abs Immature Granulocytes: 0.05 10*3/uL (ref 0.00–0.07)
Basophils Absolute: 0 10*3/uL (ref 0.0–0.1)
Basophils Relative: 0 %
Eosinophils Absolute: 0 10*3/uL (ref 0.0–0.5)
Eosinophils Relative: 0 %
HCT: 46.5 % (ref 39.0–52.0)
Hemoglobin: 15.7 g/dL (ref 13.0–17.0)
Immature Granulocytes: 0 %
Lymphocytes Relative: 7 %
Lymphs Abs: 0.9 10*3/uL (ref 0.7–4.0)
MCH: 29.4 pg (ref 26.0–34.0)
MCHC: 33.8 g/dL (ref 30.0–36.0)
MCV: 87.1 fL (ref 80.0–100.0)
Monocytes Absolute: 0.8 10*3/uL (ref 0.1–1.0)
Monocytes Relative: 6 %
Neutro Abs: 10.6 10*3/uL — ABNORMAL HIGH (ref 1.7–7.7)
Neutrophils Relative %: 87 %
Platelets: 223 10*3/uL (ref 150–400)
RBC: 5.34 MIL/uL (ref 4.22–5.81)
RDW: 13.5 % (ref 11.5–15.5)
WBC: 12.4 10*3/uL — ABNORMAL HIGH (ref 4.0–10.5)
nRBC: 0 % (ref 0.0–0.2)

## 2022-04-30 LAB — TROPONIN I (HIGH SENSITIVITY)
Troponin I (High Sensitivity): 12 ng/L (ref <18)
Troponin I (High Sensitivity): 11 ng/L (ref <18)

## 2022-04-30 LAB — COMPREHENSIVE METABOLIC PANEL
ALT: 29 U/L (ref 0–44)
AST: 30 U/L (ref 15–41)
Albumin: 4 g/dL (ref 3.5–5.0)
Alkaline Phosphatase: 44 U/L (ref 38–126)
Anion gap: 10 (ref 5–15)
BUN: 18 mg/dL (ref 8–23)
CO2: 23 mmol/L (ref 22–32)
Calcium: 9 mg/dL (ref 8.9–10.3)
Chloride: 103 mmol/L (ref 98–111)
Creatinine, Ser: 1.18 mg/dL (ref 0.61–1.24)
GFR, Estimated: >60 mL/min (ref >60)
Glucose, Bld: 108 mg/dL — ABNORMAL HIGH (ref 70–99)
Potassium: 3.8 mmol/L (ref 3.5–5.1)
Sodium: 136 mmol/L (ref 135–145)
Total Bilirubin: 0.9 mg/dL (ref 0.3–1.2)
Total Protein: 7.6 g/dL (ref 6.5–8.1)

## 2022-04-30 LAB — URINALYSIS, ROUTINE W REFLEX MICROSCOPIC
Bilirubin Urine: NEGATIVE
Glucose, UA: NEGATIVE mg/dL
Hgb urine dipstick: NEGATIVE
Ketones, ur: 20 mg/dL — AB
Leukocytes,Ua: NEGATIVE
Nitrite: NEGATIVE
Protein, ur: 30 mg/dL — AB
Specific Gravity, Urine: 1.024 (ref 1.005–1.030)
pH: 5 (ref 5.0–8.0)

## 2022-04-30 LAB — RESP PANEL BY RT-PCR (FLU A&B, COVID) ARPGX2
Influenza A by PCR: NEGATIVE
Influenza B by PCR: NEGATIVE
SARS Coronavirus 2 by RT PCR: NEGATIVE

## 2022-04-30 MED ORDER — LACTATED RINGERS IV BOLUS
1000.0000 mL | Freq: Once | INTRAVENOUS | Status: AC
  Administered 2022-04-30: 1000 mL via INTRAVENOUS

## 2022-04-30 NOTE — ED Triage Notes (Signed)
Patient has had 6 bypass surgeries since 1996. Came in for [place] can not stand on own, knees gave out.

## 2022-04-30 NOTE — ED Notes (Signed)
Pt was a 2 person assist while walking o2 stayed around 92% pt states he walks with walker or cane at home. Corey Glover

## 2022-04-30 NOTE — ED Notes (Signed)
Daughter Efraim Kaufmann

## 2022-04-30 NOTE — ED Notes (Signed)
Daughter Lossie Faes POA states her dad has dementia even though he seems alert and oriented, very forgetful. She is on her way to his house to get his wallet and will be returning shortly.

## 2022-04-30 NOTE — ED Provider Notes (Signed)
[facility] EMERGENCY DEPARTMENT Provider Note  CSN: [id number] Arrival date & time: 04/30/22 1622  Chief Complaint(s) Weakness (Patient states he didn't fall his knees just gave out but he can not stand on his own. Had a significant orthostatic change from sitting to standing BP 128/88 sitting and 160/95 standing. Daughter, Efraim Kaufmann, by side.)  HPI Corey Glover is a 76 y.o. male with PMH BPH, CAD, HLD who presents to the emergency department for evaluation of generalized weakness.  History obtained from patient and daughter who states that patient has recently over the last 24 hours found himself unable to walk and feeling like his legs are getting give out.  He denies chest pain, shortness of breath, abdominal pain, nausea, vomiting, diarrhea, headache, fever or any other systemic symptoms.  Denies numbness, tingling, weakness or other neurologic complaints.   Past Medical History Past Medical History:  Diagnosis Date   BPH (benign prostatic hyperplasia)    CAD in native artery    Hyperlipidemia    Patient Active Problem List   Diagnosis Date Noted   Allergic rhinitis 08/07/2021   Cervical radiculopathy 08/07/2021   Chronic ischemic heart disease 08/07/2021   Constipation, unspecified 08/07/2021   Pain in joint, shoulder region 08/07/2021   Reason for consultation 08/07/2021   Coronary arteriosclerosis 08/07/2021   Atherosclerotic heart disease of native coronary artery without angina pectoris 08/07/2021   Hypothyroidism 07/31/2021   Osteoarthritis of knee 04/25/2021   Prediabetes 04/25/2021   Dizziness 03/31/2021   Dyspnea on exertion 03/31/2021   Impaired fasting glucose 03/31/2021   Atherosclerosis of coronary artery without angina pectoris 03/31/2021   Benign prostatic hyperplasia 03/09/2021   Essential hypertension 03/09/2021   Other and unspecified hyperlipidemia 03/09/2021   Vitamin D deficiency 03/09/2021   Arteriosclerosis of coronary artery bypass graft  03/09/2021   Rectal bleeding 08/07/2020   H/O colonoscopy with polypectomy 08/07/2020   CAD in native artery    Home Medication(s) Prior to Admission medications   Medication Sig Start Date End Date Taking? Authorizing Provider  albuterol (VENTOLIN HFA) 108 (90 Base) MCG/ACT inhaler Inhale 2 puffs into the lungs every 6 (six) hours as needed.   Yes [provider]  aspirin EC 81 MG tablet Take 81 mg by mouth daily.   Yes [provider]  cholecalciferol (VITAMIN D3) 25 MCG (1000 UNIT) tablet Take 1,000 Units by mouth daily.   Yes [provider]  meclizine (ANTIVERT) 25 MG tablet Take 25 mg by mouth 3 (three) times daily as needed for dizziness or nausea.   Yes [provider]  Menthol, Topical Analgesic, (BLUE-EMU MAXIMUM STRENGTH EX) Apply topically.   Yes [provider]  metoprolol succinate (TOPROL-XL) 25 MG 24 hr tablet Take 12.5 mg by mouth daily.   Yes [provider]  pravastatin (PRAVACHOL) 40 MG tablet Take 40 mg by mouth daily. 05/03/20  Yes [provider]  tamsulosin (FLOMAX) 0.4 MG CAPS capsule Take 0.4 mg by mouth daily.   Yes [provider]  Past Surgical History Past Surgical History:  Procedure Laterality Date   COLONOSCOPY WITH PROPOFOL N/A 08/16/2020   Procedure: COLONOSCOPY WITH PROPOFOL;  Surgeon: Lanelle Bal, DO;  Location: [facility];  Service: Endoscopy;  Laterality: N/A;  10:15am, per office - pt not able to come earlier due to transportation   CORONARY ARTERY BYPASS GRAFT     1997 6 vessel (Dr. Laneta Simmers)   KNEE SURGERY     leg as a child   Family History Family History  Problem Relation Age of Onset   Heart disease Father    Heart disease Brother    Colon cancer Neg Hx     Social History Social History   Tobacco Use   Smoking status: Never    Smokeless tobacco: Never  Vaping Use   Vaping Use: Never used  Substance Use Topics   Alcohol use: No   Drug use: No   Allergies Crestor [rosuvastatin] and Simvastatin  Review of Systems Review of Systems  Constitutional:  Positive for fatigue.  Neurological:  Positive for weakness.    Physical Exam Vital Signs  I have reviewed the triage vital signs BP 128/67   Pulse 70   Temp 98.6 F (37 C) (Oral)   Resp 18   Ht 5\' 6"  (1.676 m)   Wt 74.8 kg   SpO2 94%   BMI 26.63 kg/m   Physical Exam Constitutional:      General: He is not in acute distress.    Appearance: Normal appearance.  HENT:     Head: Normocephalic and atraumatic.     Nose: No congestion or rhinorrhea.  Eyes:     General:        Right eye: No discharge.        Left eye: No discharge.     Extraocular Movements: Extraocular movements intact.     Pupils: Pupils are equal, round, and reactive to light.  Cardiovascular:     Rate and Rhythm: Normal rate and regular rhythm.     Heart sounds: No murmur heard. Pulmonary:     Effort: No respiratory distress.     Breath sounds: No wheezing or rales.  Abdominal:     General: There is no distension.     Tenderness: There is no abdominal tenderness.  Musculoskeletal:        General: Normal range of motion.     Cervical back: Normal range of motion.  Skin:    General: Skin is warm and dry.  Neurological:     General: No focal deficit present.     Mental Status: He is alert.     ED Results and Treatments Labs (all labs ordered are listed, but only abnormal results are displayed) Labs Reviewed  COMPREHENSIVE METABOLIC PANEL - Abnormal; Notable for the following components:      Result Value   Glucose, Bld 108 (*)    All other components within normal limits  CBC WITH DIFFERENTIAL/PLATELET - Abnormal; Notable for the following components:   WBC 12.4 (*)    Neutro Abs 10.6 (*)    All other components within normal limits  URINALYSIS, ROUTINE W REFLEX  MICROSCOPIC - Abnormal; Notable for the following components:   Ketones, ur 20 (*)    Protein, ur 30 (*)    Bacteria, UA RARE (*)    All other components within normal limits  RESP PANEL BY RT-PCR (FLU A&B, COVID) ARPGX2  TROPONIN I (HIGH SENSITIVITY)  TROPONIN I (HIGH SENSITIVITY)  Radiology No results found.  Pertinent labs & imaging results that were available during my care of the patient were reviewed by me and considered in my medical decision making (see MDM for details).  Medications Ordered in ED Medications  lactated ringers bolus 1,000 mL (0 mLs Intravenous Stopped 04/30/22 2117)                                                                                                                                     Procedures Procedures  (including critical care time)  Medical Decision Making / ED Course   This patient presents to the ED for concern of generalized weakness, this involves an extensive number of treatment options, and is a complaint that carries with it a high risk of complications and morbidity.  The differential diagnosis includes electrolyte abnormality, dehydration, orthostatic presyncope, UTI, deconditioning  MDM: Patient seen emergency room for evaluation of generalized weakness.  Physical exam is unremarkable.  Laboratory evaluation is largley unremarkable outside of a slight leukocytosis to 12.4  negative urinalysis, COVID-negative, flu negative.  Patient was given 1 L lactated Ringer's for suspected hypovolemia and patient was able to ambulate in the emergency department without significant difficulty.  During his amatory pulse ox he had no hypoxia or tachycardia or obvious shortness of breath.  I had a long discussion with the patient and the patient's daughter and offered physical therapy evaluation in the ED in the morning with overnight  observation but the patient adamantly wanted to go home.  Given his negative emergency department work-up, this is not unreasonable but I did encourage him to reach out to the patient's primary care physician to help set up outpatient home health/physical therapy.  They were given return precautions of which she voiced understanding the patient was ultimately discharged   Additional history obtained: -Additional history obtained from daughter -External records from outside source obtained and reviewed including: Chart review including previous notes, labs, imaging, consultation notes   Lab Tests: -I ordered, reviewed, and interpreted labs.   The pertinent results include:   Labs Reviewed  COMPREHENSIVE METABOLIC PANEL - Abnormal; Notable for the following components:      Result Value   Glucose, Bld 108 (*)    All other components within normal limits  CBC WITH DIFFERENTIAL/PLATELET - Abnormal; Notable for the following components:   WBC 12.4 (*)    Neutro Abs 10.6 (*)    All other components within normal limits  URINALYSIS, ROUTINE W REFLEX MICROSCOPIC - Abnormal; Notable for the following components:   Ketones, ur 20 (*)    Protein, ur 30 (*)    Bacteria, UA RARE (*)    All other components within normal limits  RESP PANEL BY RT-PCR (FLU A&B, COVID) ARPGX2  TROPONIN I (HIGH SENSITIVITY)  TROPONIN I (HIGH SENSITIVITY)        Medicines ordered and prescription drug management: Meds ordered this encounter  Medications  lactated ringers bolus 1,000 mL    -I have reviewed the patients home medicines and have made adjustments as needed  Critical interventions none    Cardiac Monitoring: The patient was maintained on a cardiac monitor.  I personally viewed and interpreted the cardiac monitored which showed an underlying rhythm of: NSR  Social Determinants of Health:  Factors impacting patients care include: Patient lives at home with his son   Reevaluation: After the  interventions noted above, I reevaluated the patient and found that they have :stayed the same  Co morbidities that complicate the patient evaluation  Past Medical History:  Diagnosis Date   BPH (benign prostatic hyperplasia)    CAD in native artery    Hyperlipidemia       Dispostion: I considered admission for this patient, but he does not meet inpatient criteria for admission and safe for discharge to outpatient follow-up     Final Clinical Impression(s) / ED Diagnoses Final diagnoses:  Generalized weakness     @PCDICTATION @    Monic Engelmann, , MD 05/01/22 1503

## 2022-05-01 DIAGNOSIS — J00 Acute nasopharyngitis [common cold]: Secondary | ICD-10-CM | POA: Diagnosis not present

## 2022-05-01 DIAGNOSIS — R296 Repeated falls: Secondary | ICD-10-CM | POA: Diagnosis not present

## 2022-05-09 DIAGNOSIS — R7303 Prediabetes: Secondary | ICD-10-CM | POA: Diagnosis not present

## 2022-05-09 DIAGNOSIS — E782 Mixed hyperlipidemia: Secondary | ICD-10-CM | POA: Diagnosis not present

## 2022-05-09 DIAGNOSIS — R42 Dizziness and giddiness: Secondary | ICD-10-CM | POA: Diagnosis not present

## 2022-05-09 DIAGNOSIS — E559 Vitamin D deficiency, unspecified: Secondary | ICD-10-CM | POA: Diagnosis not present

## 2022-05-12 DIAGNOSIS — I10 Essential (primary) hypertension: Secondary | ICD-10-CM | POA: Diagnosis not present

## 2022-05-12 DIAGNOSIS — E782 Mixed hyperlipidemia: Secondary | ICD-10-CM | POA: Diagnosis not present

## 2022-05-12 DIAGNOSIS — R300 Dysuria: Secondary | ICD-10-CM | POA: Diagnosis not present

## 2022-05-12 DIAGNOSIS — E039 Hypothyroidism, unspecified: Secondary | ICD-10-CM | POA: Diagnosis not present

## 2022-05-12 DIAGNOSIS — I25810 Atherosclerosis of coronary artery bypass graft(s) without angina pectoris: Secondary | ICD-10-CM | POA: Diagnosis not present

## 2022-05-12 DIAGNOSIS — R7303 Prediabetes: Secondary | ICD-10-CM | POA: Diagnosis not present

## 2022-05-12 DIAGNOSIS — E559 Vitamin D deficiency, unspecified: Secondary | ICD-10-CM | POA: Diagnosis not present

## 2022-05-12 DIAGNOSIS — M179 Osteoarthritis of knee, unspecified: Secondary | ICD-10-CM | POA: Diagnosis not present

## 2022-05-12 DIAGNOSIS — R0609 Other forms of dyspnea: Secondary | ICD-10-CM | POA: Diagnosis not present

## 2022-05-12 DIAGNOSIS — R42 Dizziness and giddiness: Secondary | ICD-10-CM | POA: Diagnosis not present

## 2022-05-19 DIAGNOSIS — R42 Dizziness and giddiness: Secondary | ICD-10-CM | POA: Diagnosis not present

## 2022-05-19 DIAGNOSIS — R7303 Prediabetes: Secondary | ICD-10-CM | POA: Diagnosis not present

## 2022-05-19 DIAGNOSIS — Z7982 Long term (current) use of aspirin: Secondary | ICD-10-CM | POA: Diagnosis not present

## 2022-05-19 DIAGNOSIS — E039 Hypothyroidism, unspecified: Secondary | ICD-10-CM | POA: Diagnosis not present

## 2022-05-19 DIAGNOSIS — E559 Vitamin D deficiency, unspecified: Secondary | ICD-10-CM | POA: Diagnosis not present

## 2022-05-19 DIAGNOSIS — M160 Bilateral primary osteoarthritis of hip: Secondary | ICD-10-CM | POA: Diagnosis not present

## 2022-05-19 DIAGNOSIS — E782 Mixed hyperlipidemia: Secondary | ICD-10-CM | POA: Diagnosis not present

## 2022-05-19 DIAGNOSIS — I25810 Atherosclerosis of coronary artery bypass graft(s) without angina pectoris: Secondary | ICD-10-CM | POA: Diagnosis not present

## 2022-05-19 DIAGNOSIS — Z9181 History of falling: Secondary | ICD-10-CM | POA: Diagnosis not present

## 2022-05-19 DIAGNOSIS — R296 Repeated falls: Secondary | ICD-10-CM | POA: Diagnosis not present

## 2022-05-23 DIAGNOSIS — R42 Dizziness and giddiness: Secondary | ICD-10-CM | POA: Diagnosis not present

## 2022-05-23 DIAGNOSIS — Z9181 History of falling: Secondary | ICD-10-CM | POA: Diagnosis not present

## 2022-05-23 DIAGNOSIS — R7303 Prediabetes: Secondary | ICD-10-CM | POA: Diagnosis not present

## 2022-05-23 DIAGNOSIS — E039 Hypothyroidism, unspecified: Secondary | ICD-10-CM | POA: Diagnosis not present

## 2022-05-23 DIAGNOSIS — E559 Vitamin D deficiency, unspecified: Secondary | ICD-10-CM | POA: Diagnosis not present

## 2022-05-23 DIAGNOSIS — R296 Repeated falls: Secondary | ICD-10-CM | POA: Diagnosis not present

## 2022-05-23 DIAGNOSIS — Z7982 Long term (current) use of aspirin: Secondary | ICD-10-CM | POA: Diagnosis not present

## 2022-05-23 DIAGNOSIS — E782 Mixed hyperlipidemia: Secondary | ICD-10-CM | POA: Diagnosis not present

## 2022-05-23 DIAGNOSIS — M160 Bilateral primary osteoarthritis of hip: Secondary | ICD-10-CM | POA: Diagnosis not present

## 2022-05-23 DIAGNOSIS — I25810 Atherosclerosis of coronary artery bypass graft(s) without angina pectoris: Secondary | ICD-10-CM | POA: Diagnosis not present

## 2022-05-27 DIAGNOSIS — I25810 Atherosclerosis of coronary artery bypass graft(s) without angina pectoris: Secondary | ICD-10-CM | POA: Diagnosis not present

## 2022-05-27 DIAGNOSIS — E782 Mixed hyperlipidemia: Secondary | ICD-10-CM | POA: Diagnosis not present

## 2022-05-27 DIAGNOSIS — Z9181 History of falling: Secondary | ICD-10-CM | POA: Diagnosis not present

## 2022-05-27 DIAGNOSIS — Z7982 Long term (current) use of aspirin: Secondary | ICD-10-CM | POA: Diagnosis not present

## 2022-05-27 DIAGNOSIS — E559 Vitamin D deficiency, unspecified: Secondary | ICD-10-CM | POA: Diagnosis not present

## 2022-05-27 DIAGNOSIS — E039 Hypothyroidism, unspecified: Secondary | ICD-10-CM | POA: Diagnosis not present

## 2022-05-27 DIAGNOSIS — M160 Bilateral primary osteoarthritis of hip: Secondary | ICD-10-CM | POA: Diagnosis not present

## 2022-05-27 DIAGNOSIS — R42 Dizziness and giddiness: Secondary | ICD-10-CM | POA: Diagnosis not present

## 2022-05-27 DIAGNOSIS — R296 Repeated falls: Secondary | ICD-10-CM | POA: Diagnosis not present

## 2022-05-27 DIAGNOSIS — R7303 Prediabetes: Secondary | ICD-10-CM | POA: Diagnosis not present

## 2022-05-29 DIAGNOSIS — R7303 Prediabetes: Secondary | ICD-10-CM | POA: Diagnosis not present

## 2022-05-29 DIAGNOSIS — I25810 Atherosclerosis of coronary artery bypass graft(s) without angina pectoris: Secondary | ICD-10-CM | POA: Diagnosis not present

## 2022-05-29 DIAGNOSIS — Z9181 History of falling: Secondary | ICD-10-CM | POA: Diagnosis not present

## 2022-05-29 DIAGNOSIS — E559 Vitamin D deficiency, unspecified: Secondary | ICD-10-CM | POA: Diagnosis not present

## 2022-05-29 DIAGNOSIS — E039 Hypothyroidism, unspecified: Secondary | ICD-10-CM | POA: Diagnosis not present

## 2022-05-29 DIAGNOSIS — M160 Bilateral primary osteoarthritis of hip: Secondary | ICD-10-CM | POA: Diagnosis not present

## 2022-05-29 DIAGNOSIS — R42 Dizziness and giddiness: Secondary | ICD-10-CM | POA: Diagnosis not present

## 2022-05-29 DIAGNOSIS — R296 Repeated falls: Secondary | ICD-10-CM | POA: Diagnosis not present

## 2022-05-29 DIAGNOSIS — E782 Mixed hyperlipidemia: Secondary | ICD-10-CM | POA: Diagnosis not present

## 2022-05-29 DIAGNOSIS — Z7982 Long term (current) use of aspirin: Secondary | ICD-10-CM | POA: Diagnosis not present

## 2022-06-10 ENCOUNTER — Ambulatory Visit: Payer: Medicare Other | Admitting: Urology

## 2022-06-10 VITALS — BP 124/74 | HR 65

## 2022-06-10 DIAGNOSIS — N138 Other obstructive and reflux uropathy: Secondary | ICD-10-CM | POA: Diagnosis not present

## 2022-06-10 DIAGNOSIS — R972 Elevated prostate specific antigen [PSA]: Secondary | ICD-10-CM

## 2022-06-10 DIAGNOSIS — R300 Dysuria: Secondary | ICD-10-CM | POA: Diagnosis not present

## 2022-06-10 DIAGNOSIS — N401 Enlarged prostate with lower urinary tract symptoms: Secondary | ICD-10-CM

## 2022-06-10 MED ORDER — SULFAMETHOXAZOLE-TRIMETHOPRIM 800-160 MG PO TABS: 1.0000 | ORAL_TABLET | Freq: Two times a day (BID) | ORAL | 0 refills | Status: DC

## 2022-06-10 NOTE — Progress Notes (Signed)
H&P  Chief Complaint: Elevated PSA  History of Present Illness: 76 year old male presents for evaluation and management of elevated PSA, referred by Lupita Raider, NP.  PSA was checked last month and was 23.  Prior PSA from 2018 was 1.8.  He is not needed urologic evaluation before, but apparently has been on tamsulosin for some time.  He went to the emergency room last month for weakness.  Apparently had an in and out catheter placed for urine check which was apparently normal.  The patient has had dysuria and lower urinary tract complaints since that time.  He has frequency and urgency.  Nocturia x 1.  Almost all the time he has this dysuria but is denied any foul-smelling urine or gross hematuria.  Past Medical History:  Diagnosis Date   BPH (benign prostatic hyperplasia)    CAD in native artery    Hyperlipidemia     Past Surgical History:  Procedure Laterality Date   COLONOSCOPY WITH PROPOFOL N/A 08/16/2020   Procedure: COLONOSCOPY WITH PROPOFOL;  Surgeon: Lanelle Bal, DO;  Location: [facility];  Service: Endoscopy;  Laterality: N/A;  10:15am, per office - pt not able to come earlier due to transportation   CORONARY ARTERY BYPASS GRAFT     1997 6 vessel (Dr. Laneta Simmers)   KNEE SURGERY     leg as a child    Home Medications:  Allergies as of 06/10/2022       Reactions   Crestor [rosuvastatin]    Couldn't walk   Simvastatin         Medication List        Accurate as of June 10, 2022  9:27 AM. If you have any questions, ask your nurse or doctor.          albuterol 108 (90 Base) MCG/ACT inhaler Commonly known as: VENTOLIN HFA Inhale 2 puffs into the lungs every 6 (six) hours as needed.   aspirin EC 81 MG tablet Take 81 mg by mouth daily.   BLUE-EMU MAXIMUM STRENGTH EX Apply topically.   cholecalciferol 25 MCG (1000 UNIT) tablet Commonly known as: VITAMIN D3 Take 1,000 Units by mouth daily.   meclizine 25 MG tablet Commonly known as:  ANTIVERT Take 25 mg by mouth 3 (three) times daily as needed for dizziness or nausea.   metoprolol succinate 25 MG 24 hr tablet Commonly known as: TOPROL-XL Take 12.5 mg by mouth daily.   pravastatin 40 MG tablet Commonly known as: PRAVACHOL Take 40 mg by mouth daily.   tamsulosin 0.4 MG Caps capsule Commonly known as: FLOMAX Take 0.4 mg by mouth daily.        Allergies:  Allergies  Allergen Reactions   Crestor [Rosuvastatin]     Couldn't walk   Simvastatin     Family History  Problem Relation Age of Onset   Heart disease Father    Heart disease Brother    Colon cancer Neg Hx     Social History:  reports that he has never smoked. He has never used smokeless tobacco. He reports that he does not drink alcohol and does not use drugs.  ROS: A complete review of systems was performed.  All systems are negative except for pertinent findings as noted.  Physical Exam:  Vital signs in last 24 hours: There were no vitals taken for this visit. Constitutional:  Alert and oriented, No acute distress Cardiovascular: Regular rate  Respiratory: Normal respiratory effort GI: Abdomen is obese, soft, nondistended, nontender.  No  inguinal hernias. Genitourinary: Normal uncircumcised male phallus, testes are descended bilaterally and non-tender and without masses, scrotum is normal in appearance without lesions or masses, perineum is normal on inspection.  Normal anal tone, gland 30 g, symmetric, nonnodular, nontender. Lymphatic: No lymphadenopathy Neurologic: Grossly intact, no focal deficits Psychiatric: Normal mood and affect  I have reviewed prior pt notes  I have reviewed notes from referring/previous physicians-ER notes reviewed  I have reviewed urinalysis results  I have reviewed prior PSA results  Bladder scan volume 120 mL   Impression/Assessment:  1.  BPH with symptoms.  He has mild to moderate residual urine volume.  This is okay from my standpoint  2.  Dysuria,  question etiology  3.  Elevated PSA-1.8 in 2018, 23 recently.  This may be due to non neoplastic factors  Plan:  1.  I cultured his urine and put him on Bactrim for 10 days  2.  Continue tamsulosin  3.  I will have him come back in 6 weeks following PSA for recheck  CC: Lupita Raider, NP

## 2022-06-11 LAB — URINALYSIS, ROUTINE W REFLEX MICROSCOPIC
Bilirubin, UA: NEGATIVE
Glucose, UA: NEGATIVE
Ketones, UA: NEGATIVE
Nitrite, UA: NEGATIVE
Protein,UA: NEGATIVE
Specific Gravity, UA: <1.005 — ABNORMAL LOW (ref 1.005–1.030)
Urobilinogen, Ur: 0.2 mg/dL (ref 0.2–1.0)
pH, UA: 5.5 (ref 5.0–7.5)

## 2022-06-11 LAB — MICROSCOPIC EXAMINATION
Bacteria, UA: NONE SEEN
WBC, UA: >30 /hpf — AB (ref 0–5)

## 2022-06-13 LAB — URINE CULTURE

## 2022-06-26 ENCOUNTER — Telehealth: Payer: Self-pay

## 2022-06-26 NOTE — Telephone Encounter (Signed)
-----   Message from Franchot Gallo, MD sent at 06/20/2022 11:57 AM EST ----- Let pt know that he did have a uti--he was put on proper abx ----- Message ----- From: Audie Box, CMA Sent: 06/19/2022  10:47 AM EST To: Franchot Gallo, MD  Please review, patient started on Bactrim on 12/19

## 2022-06-26 NOTE — Telephone Encounter (Signed)
Unable to reach patient by phone after multiple attempts.  Spoke to daughter and made her aware of MD response.  Daughter listed under alternate contact.

## 2022-07-09 ENCOUNTER — Ambulatory Visit: Payer: Medicare Other | Admitting: Neurology

## 2022-07-21 NOTE — Progress Notes (Signed)
History of Present Illness:   12.19.2024:  presents for evaluation and management of elevated PSA, referred by Cecile Sheerer, NP.   PSA was checked last month and was 23.  Prior PSA from 2018 was 1.8.   He is not needed urologic evaluation before, but apparently has been on tamsulosin for some time.   He went to the emergency room last month for weakness.  Apparently had an in and out catheter placed for urine check which was apparently normal.  The patient has had dysuria and lower urinary tract complaints since that time.  He has frequency and urgency.  Nocturia x 1.  Almost all the time he has this dysuria but is denied any foul-smelling urine or gross hematuria.  Culture + for staph haemolyticus. Placed on Bactrim.  1.30.2024: He is here today for follow-up.  He was to have had PSA rechecked before this visit.  That has not been done yet.  Besides having urgency to go, he has no other urologic complaints.  He has had no dysuria or hematuria recently. Past Medical History:  Diagnosis Date   BPH (benign prostatic hyperplasia)    CAD in native artery    Hyperlipidemia     Past Surgical History:  Procedure Laterality Date   COLONOSCOPY WITH PROPOFOL N/A 08/16/2020   Procedure: COLONOSCOPY WITH PROPOFOL;  Surgeon: Eloise Harman, DO;  Location: [facility];  Service: Endoscopy;  Laterality: N/A;  10:15am, per office - pt not able to come earlier due to transportation   [place] 6 vessel (Dr. Cyndia Bent)   KNEE SURGERY     leg as a child    Home Medications:  Allergies as of 07/22/2022       Reactions   Crestor [rosuvastatin]    Couldn't walk   Simvastatin         Medication List        Accurate as of July 21, 2022  7:38 PM. If you have any questions, ask your nurse or doctor.          albuterol 108 (90 Base) MCG/ACT inhaler Commonly known as: VENTOLIN HFA Inhale 2 puffs into the lungs every 6 (six) hours as needed.   aspirin EC 81  MG tablet Take 81 mg by mouth daily.   BLUE-EMU MAXIMUM STRENGTH EX Apply topically.   cholecalciferol 25 MCG (1000 UNIT) tablet Commonly known as: VITAMIN D3 Take 1,000 Units by mouth daily.   meclizine 25 MG tablet Commonly known as: ANTIVERT Take 25 mg by mouth 3 (three) times daily as needed for dizziness or nausea.   metoprolol succinate 25 MG 24 hr tablet Commonly known as: TOPROL-XL Take 12.5 mg by mouth daily.   pravastatin 40 MG tablet Commonly known as: PRAVACHOL Take 40 mg by mouth daily.   sulfamethoxazole-trimethoprim 800-160 MG tablet Commonly known as: BACTRIM DS Take 1 tablet by mouth 2 (two) times daily.   tamsulosin 0.4 MG Caps capsule Commonly known as: FLOMAX Take 0.4 mg by mouth daily.        Allergies:  Allergies  Allergen Reactions   Crestor [Rosuvastatin]     Couldn't walk   Simvastatin     Family History  Problem Relation Age of Onset   Heart disease Father    Heart disease Brother    Colon cancer Neg Hx     Social History:  reports that he has never smoked. He has never used smokeless tobacco. He reports that  he does not drink alcohol and does not use drugs.  ROS: A complete review of systems was performed.  All systems are negative except for pertinent findings as noted.  Physical Exam:  Vital signs in last 24 hours: There were no vitals taken for this visit. Constitutional:  Alert and oriented, No acute distress Cardiovascular: Regular rate  Respiratory: Normal respiratory effort Neurologic: Grossly intact, no focal deficits Psychiatric: Normal mood and affect  I have reviewed prior pt notes  I have reviewed urinalysis results  I have reviewed prior PSA results  I have reviewed prior urine culture   Impression/Assessment:  1.  Elevated PSA.  May well have been due to concurrent urinary tract infection.  UTI is now cleared.  He has not had repeat PSA  2.  BPH with mild to moderate lower urinary tract symptoms, on  tamsulosin.  Benign feeling gland  Plan:  1.  We will recheck PSA today  2.  We will call with results and further follow-up

## 2022-07-22 ENCOUNTER — Ambulatory Visit: Payer: Medicare Other | Admitting: Urology

## 2022-07-22 ENCOUNTER — Encounter: Payer: Self-pay | Admitting: Urology

## 2022-07-22 VITALS — BP 121/75 | HR 71

## 2022-07-22 DIAGNOSIS — N401 Enlarged prostate with lower urinary tract symptoms: Secondary | ICD-10-CM

## 2022-07-22 DIAGNOSIS — R972 Elevated prostate specific antigen [PSA]: Secondary | ICD-10-CM

## 2022-07-22 DIAGNOSIS — R300 Dysuria: Secondary | ICD-10-CM | POA: Diagnosis not present

## 2022-07-22 DIAGNOSIS — N138 Other obstructive and reflux uropathy: Secondary | ICD-10-CM | POA: Diagnosis not present

## 2022-07-23 LAB — URINALYSIS, ROUTINE W REFLEX MICROSCOPIC
Bilirubin, UA: NEGATIVE
Glucose, UA: NEGATIVE
Ketones, UA: NEGATIVE
Leukocytes,UA: NEGATIVE
Nitrite, UA: NEGATIVE
Protein,UA: NEGATIVE
RBC, UA: NEGATIVE
Specific Gravity, UA: 1.025 (ref 1.005–1.030)
Urobilinogen, Ur: 0.2 mg/dL (ref 0.2–1.0)
pH, UA: 5.5 (ref 5.0–7.5)

## 2022-07-23 LAB — PSA: Prostate Specific Ag, Serum: 7.3 ng/mL — ABNORMAL HIGH (ref 0.0–4.0)

## 2022-07-24 ENCOUNTER — Other Ambulatory Visit: Payer: Self-pay | Admitting: Urology

## 2022-07-24 DIAGNOSIS — R972 Elevated prostate specific antigen [PSA]: Secondary | ICD-10-CM

## 2022-07-24 MED ORDER — LEVOFLOXACIN 750 MG PO TABS: 750.0000 mg | ORAL_TABLET | Freq: Every day | ORAL | 0 refills | Status: AC

## 2022-07-25 ENCOUNTER — Telehealth: Payer: Self-pay

## 2022-07-25 DIAGNOSIS — N401 Enlarged prostate with lower urinary tract symptoms: Secondary | ICD-10-CM

## 2022-07-25 DIAGNOSIS — N138 Other obstructive and reflux uropathy: Secondary | ICD-10-CM

## 2022-07-25 DIAGNOSIS — R972 Elevated prostate specific antigen [PSA]: Secondary | ICD-10-CM

## 2022-07-25 DIAGNOSIS — R300 Dysuria: Secondary | ICD-10-CM

## 2022-07-25 NOTE — Telephone Encounter (Signed)
-----   Message from Stephen Dahlstedt, MD sent at 07/24/2022  5:59 PM EST ----- Please call pt--psa still high @ 7.6. I would recommend TRUS/Bx. I put orders in--can you schedule? ----- Message ----- From: Cristianna Cyr R, CMA Sent: 07/23/2022  10:25 AM EST To: Stephen Dahlstedt, MD  Please review  

## 2022-07-28 NOTE — Telephone Encounter (Signed)
Tried calling patient with no answer and left VM for return call

## 2022-07-28 NOTE — Telephone Encounter (Signed)
-----   Message from Franchot Gallo, MD sent at 07/24/2022  5:59 PM EST ----- Please call pt--psa still high @ 7.6. I would recommend TRUS/Bx. I put orders in--can you schedule? ----- Message ----- From: Sherrilyn Rist, CMA Sent: 07/23/2022  10:25 AM EST To: Franchot Gallo, MD  Please review

## 2022-07-28 NOTE — Telephone Encounter (Signed)
Made daughter Lenna Sciara aware that dad's psa still high @ 7.6 and Dr. Diona Fanti recommend TRUS/Bx. Prostate biopsy instruction give to daughter over phone and a copy has been put up front for daughter to pick up. Daughter voiced understanding

## 2022-07-31 MED ORDER — LEVOFLOXACIN 750 MG PO TABS: 750.0000 mg | ORAL_TABLET | Freq: Once | ORAL | 0 refills | Status: AC

## 2022-07-31 NOTE — Addendum Note (Signed)
Addended by: Darcella Gasman R on: 07/31/2022 04:40 PM   Modules accepted: Orders

## 2022-08-01 DIAGNOSIS — Z9282 Status post administration of tPA (rtPA) in a different facility within the last 24 hours prior to admission to current facility: Secondary | ICD-10-CM | POA: Diagnosis not present

## 2022-08-01 DIAGNOSIS — E039 Hypothyroidism, unspecified: Secondary | ICD-10-CM | POA: Diagnosis not present

## 2022-08-06 DIAGNOSIS — E559 Vitamin D deficiency, unspecified: Secondary | ICD-10-CM | POA: Diagnosis not present

## 2022-08-06 DIAGNOSIS — E782 Mixed hyperlipidemia: Secondary | ICD-10-CM | POA: Diagnosis not present

## 2022-08-06 DIAGNOSIS — I10 Essential (primary) hypertension: Secondary | ICD-10-CM | POA: Diagnosis not present

## 2022-08-06 DIAGNOSIS — E039 Hypothyroidism, unspecified: Secondary | ICD-10-CM | POA: Diagnosis not present

## 2022-08-06 DIAGNOSIS — I25810 Atherosclerosis of coronary artery bypass graft(s) without angina pectoris: Secondary | ICD-10-CM | POA: Diagnosis not present

## 2022-08-06 DIAGNOSIS — Z9181 History of falling: Secondary | ICD-10-CM | POA: Diagnosis not present

## 2022-08-06 DIAGNOSIS — M170 Bilateral primary osteoarthritis of knee: Secondary | ICD-10-CM | POA: Diagnosis not present

## 2022-08-06 DIAGNOSIS — R7303 Prediabetes: Secondary | ICD-10-CM | POA: Diagnosis not present

## 2022-08-11 DIAGNOSIS — R7303 Prediabetes: Secondary | ICD-10-CM | POA: Diagnosis not present

## 2022-08-11 DIAGNOSIS — M170 Bilateral primary osteoarthritis of knee: Secondary | ICD-10-CM | POA: Diagnosis not present

## 2022-08-11 DIAGNOSIS — E782 Mixed hyperlipidemia: Secondary | ICD-10-CM | POA: Diagnosis not present

## 2022-08-11 DIAGNOSIS — Z9181 History of falling: Secondary | ICD-10-CM | POA: Diagnosis not present

## 2022-08-11 DIAGNOSIS — I10 Essential (primary) hypertension: Secondary | ICD-10-CM | POA: Diagnosis not present

## 2022-08-11 DIAGNOSIS — E559 Vitamin D deficiency, unspecified: Secondary | ICD-10-CM | POA: Diagnosis not present

## 2022-08-11 DIAGNOSIS — E039 Hypothyroidism, unspecified: Secondary | ICD-10-CM | POA: Diagnosis not present

## 2022-08-11 DIAGNOSIS — I25810 Atherosclerosis of coronary artery bypass graft(s) without angina pectoris: Secondary | ICD-10-CM | POA: Diagnosis not present

## 2022-08-12 DIAGNOSIS — I25810 Atherosclerosis of coronary artery bypass graft(s) without angina pectoris: Secondary | ICD-10-CM | POA: Diagnosis not present

## 2022-08-12 DIAGNOSIS — E039 Hypothyroidism, unspecified: Secondary | ICD-10-CM | POA: Diagnosis not present

## 2022-08-12 DIAGNOSIS — Z9181 History of falling: Secondary | ICD-10-CM | POA: Diagnosis not present

## 2022-08-12 DIAGNOSIS — E782 Mixed hyperlipidemia: Secondary | ICD-10-CM | POA: Diagnosis not present

## 2022-08-12 DIAGNOSIS — R7303 Prediabetes: Secondary | ICD-10-CM | POA: Diagnosis not present

## 2022-08-12 DIAGNOSIS — I10 Essential (primary) hypertension: Secondary | ICD-10-CM | POA: Diagnosis not present

## 2022-08-12 DIAGNOSIS — E559 Vitamin D deficiency, unspecified: Secondary | ICD-10-CM | POA: Diagnosis not present

## 2022-08-12 DIAGNOSIS — M170 Bilateral primary osteoarthritis of knee: Secondary | ICD-10-CM | POA: Diagnosis not present

## 2022-08-15 ENCOUNTER — Other Ambulatory Visit: Payer: Self-pay | Admitting: Urology

## 2022-08-15 DIAGNOSIS — R972 Elevated prostate specific antigen [PSA]: Secondary | ICD-10-CM

## 2022-08-21 ENCOUNTER — Encounter: Payer: Self-pay | Admitting: Radiology

## 2022-08-21 DIAGNOSIS — E559 Vitamin D deficiency, unspecified: Secondary | ICD-10-CM | POA: Diagnosis not present

## 2022-08-21 DIAGNOSIS — Z9181 History of falling: Secondary | ICD-10-CM | POA: Diagnosis not present

## 2022-08-21 DIAGNOSIS — I10 Essential (primary) hypertension: Secondary | ICD-10-CM | POA: Diagnosis not present

## 2022-08-21 DIAGNOSIS — M170 Bilateral primary osteoarthritis of knee: Secondary | ICD-10-CM | POA: Diagnosis not present

## 2022-08-21 DIAGNOSIS — E039 Hypothyroidism, unspecified: Secondary | ICD-10-CM | POA: Diagnosis not present

## 2022-08-21 DIAGNOSIS — R7303 Prediabetes: Secondary | ICD-10-CM | POA: Diagnosis not present

## 2022-08-21 DIAGNOSIS — I25810 Atherosclerosis of coronary artery bypass graft(s) without angina pectoris: Secondary | ICD-10-CM | POA: Diagnosis not present

## 2022-08-21 DIAGNOSIS — E782 Mixed hyperlipidemia: Secondary | ICD-10-CM | POA: Diagnosis not present

## 2022-08-22 DIAGNOSIS — I25810 Atherosclerosis of coronary artery bypass graft(s) without angina pectoris: Secondary | ICD-10-CM | POA: Diagnosis not present

## 2022-08-22 DIAGNOSIS — E559 Vitamin D deficiency, unspecified: Secondary | ICD-10-CM | POA: Diagnosis not present

## 2022-08-22 DIAGNOSIS — Z9181 History of falling: Secondary | ICD-10-CM | POA: Diagnosis not present

## 2022-08-22 DIAGNOSIS — R7303 Prediabetes: Secondary | ICD-10-CM | POA: Diagnosis not present

## 2022-08-22 DIAGNOSIS — E782 Mixed hyperlipidemia: Secondary | ICD-10-CM | POA: Diagnosis not present

## 2022-08-22 DIAGNOSIS — I10 Essential (primary) hypertension: Secondary | ICD-10-CM | POA: Diagnosis not present

## 2022-08-22 DIAGNOSIS — E039 Hypothyroidism, unspecified: Secondary | ICD-10-CM | POA: Diagnosis not present

## 2022-08-22 DIAGNOSIS — M170 Bilateral primary osteoarthritis of knee: Secondary | ICD-10-CM | POA: Diagnosis not present

## 2022-08-26 ENCOUNTER — Other Ambulatory Visit (HOSPITAL_COMMUNITY): Payer: Medicare Other

## 2022-08-26 DIAGNOSIS — R7303 Prediabetes: Secondary | ICD-10-CM | POA: Diagnosis not present

## 2022-08-26 DIAGNOSIS — M170 Bilateral primary osteoarthritis of knee: Secondary | ICD-10-CM | POA: Diagnosis not present

## 2022-08-26 DIAGNOSIS — E559 Vitamin D deficiency, unspecified: Secondary | ICD-10-CM | POA: Diagnosis not present

## 2022-08-26 DIAGNOSIS — I25810 Atherosclerosis of coronary artery bypass graft(s) without angina pectoris: Secondary | ICD-10-CM | POA: Diagnosis not present

## 2022-08-26 DIAGNOSIS — I10 Essential (primary) hypertension: Secondary | ICD-10-CM | POA: Diagnosis not present

## 2022-08-26 DIAGNOSIS — E039 Hypothyroidism, unspecified: Secondary | ICD-10-CM | POA: Diagnosis not present

## 2022-08-26 DIAGNOSIS — E782 Mixed hyperlipidemia: Secondary | ICD-10-CM | POA: Diagnosis not present

## 2022-08-26 DIAGNOSIS — Z9181 History of falling: Secondary | ICD-10-CM | POA: Diagnosis not present

## 2022-08-28 DIAGNOSIS — E039 Hypothyroidism, unspecified: Secondary | ICD-10-CM | POA: Diagnosis not present

## 2022-08-28 DIAGNOSIS — I10 Essential (primary) hypertension: Secondary | ICD-10-CM | POA: Diagnosis not present

## 2022-08-28 DIAGNOSIS — R7303 Prediabetes: Secondary | ICD-10-CM | POA: Diagnosis not present

## 2022-08-28 DIAGNOSIS — E559 Vitamin D deficiency, unspecified: Secondary | ICD-10-CM | POA: Diagnosis not present

## 2022-08-28 DIAGNOSIS — M170 Bilateral primary osteoarthritis of knee: Secondary | ICD-10-CM | POA: Diagnosis not present

## 2022-08-28 DIAGNOSIS — I25810 Atherosclerosis of coronary artery bypass graft(s) without angina pectoris: Secondary | ICD-10-CM | POA: Diagnosis not present

## 2022-08-28 DIAGNOSIS — Z9181 History of falling: Secondary | ICD-10-CM | POA: Diagnosis not present

## 2022-08-28 DIAGNOSIS — E782 Mixed hyperlipidemia: Secondary | ICD-10-CM | POA: Diagnosis not present

## 2022-09-01 DIAGNOSIS — I25810 Atherosclerosis of coronary artery bypass graft(s) without angina pectoris: Secondary | ICD-10-CM | POA: Diagnosis not present

## 2022-09-01 DIAGNOSIS — E782 Mixed hyperlipidemia: Secondary | ICD-10-CM | POA: Diagnosis not present

## 2022-09-01 DIAGNOSIS — M170 Bilateral primary osteoarthritis of knee: Secondary | ICD-10-CM | POA: Diagnosis not present

## 2022-09-01 DIAGNOSIS — Z9181 History of falling: Secondary | ICD-10-CM | POA: Diagnosis not present

## 2022-09-01 DIAGNOSIS — E559 Vitamin D deficiency, unspecified: Secondary | ICD-10-CM | POA: Diagnosis not present

## 2022-09-01 DIAGNOSIS — I10 Essential (primary) hypertension: Secondary | ICD-10-CM | POA: Diagnosis not present

## 2022-09-01 DIAGNOSIS — R7303 Prediabetes: Secondary | ICD-10-CM | POA: Diagnosis not present

## 2022-09-01 DIAGNOSIS — E039 Hypothyroidism, unspecified: Secondary | ICD-10-CM | POA: Diagnosis not present

## 2022-09-03 DIAGNOSIS — E559 Vitamin D deficiency, unspecified: Secondary | ICD-10-CM | POA: Diagnosis not present

## 2022-09-03 DIAGNOSIS — E782 Mixed hyperlipidemia: Secondary | ICD-10-CM | POA: Diagnosis not present

## 2022-09-03 DIAGNOSIS — R7303 Prediabetes: Secondary | ICD-10-CM | POA: Diagnosis not present

## 2022-09-03 DIAGNOSIS — R42 Dizziness and giddiness: Secondary | ICD-10-CM | POA: Diagnosis not present

## 2022-09-04 ENCOUNTER — Other Ambulatory Visit: Payer: Self-pay

## 2022-09-04 DIAGNOSIS — E782 Mixed hyperlipidemia: Secondary | ICD-10-CM | POA: Diagnosis not present

## 2022-09-04 DIAGNOSIS — M170 Bilateral primary osteoarthritis of knee: Secondary | ICD-10-CM | POA: Diagnosis not present

## 2022-09-04 DIAGNOSIS — I10 Essential (primary) hypertension: Secondary | ICD-10-CM | POA: Diagnosis not present

## 2022-09-04 DIAGNOSIS — R7303 Prediabetes: Secondary | ICD-10-CM | POA: Diagnosis not present

## 2022-09-04 DIAGNOSIS — E559 Vitamin D deficiency, unspecified: Secondary | ICD-10-CM | POA: Diagnosis not present

## 2022-09-04 DIAGNOSIS — E039 Hypothyroidism, unspecified: Secondary | ICD-10-CM | POA: Diagnosis not present

## 2022-09-04 DIAGNOSIS — I25810 Atherosclerosis of coronary artery bypass graft(s) without angina pectoris: Secondary | ICD-10-CM | POA: Diagnosis not present

## 2022-09-04 DIAGNOSIS — Z9181 History of falling: Secondary | ICD-10-CM | POA: Diagnosis not present

## 2022-09-04 MED ORDER — LEVOFLOXACIN 750 MG PO TABS: 750.0000 mg | ORAL_TABLET | Freq: Once | ORAL | 0 refills | Status: AC

## 2022-09-08 NOTE — Progress Notes (Signed)
Risks, benefits, and some of the potential complications of a transrectal ultrasounds of the prostate (TRUSP) with biopsies were discussed at length with the patient including gross hematuria, blood in the bowel movements, hematospermia, bacteremia, infection, voiding discomfort, urinary retention, fever, chills, sepsis, blood transfusion, death, and others. All questions were answered. Informed consent was obtained. The patient confirmed that he had taken his pre-procedure antibiotic. All anticoagulants were discontinued prior to the procedure. The patient emptied his bladder. He was positioned in a comfortable left lateral decubitus position with hips and knees acutely flexed.  The rectal probe was inserted into the rectum without difficulty. 10cc of 2% Lidocaine without epinephrine was instilled with a spinal needle using ultrasound guidance near the junction of each seminal vesicle and the prostate.  Sequential transverse (axial) scans were made in small increments beginning at the seminal vesicles and ending at the prostatic apex. Sequential longitudinal (saggital) scans were made in small increments beginning at the right lateral prostate and ending at the left lateral prostate. Excellent anatomical imaging was obtained. The peripheral, transitional, and central zones were well-defined. The seminal vesicles were normal.  Prostate volume 30 ml.  There were no hypoechoic areas. 12 needle core biopsies were performed. 1 biopsy each was taken from the following areas:  Right lateral base, right medial base, right lateral mid prostate, right medial mid prostate, right lateral apical prostate, right medial apical prostate, left lateral base, left medial base, left lateral mid prostate, left medial mid prostate, left lateral apical prostate, left medial apical prostate.. Minimal prostatic calcifications were noted. Excellent biopsy specimens were obtained.  Follow-up rectal examination was unremarkable.  The procedure was well-tolerated and without complications. Antibiotic instructions were given. The patient was told that:  For several days:  he should increase his fluid intake and limit strenuous activity  he might have mild discomfort at the base of his penis or in his rectum  he might have blood in his urine or blood in his bowel movements  For 2-3 months:  he might have blood in his ejaculate (semen)  Instructions were given to call the office immedicately for blood clots in the urine or bowel movements, difficulty urinating, inability to urinate, urinary retention, painful or frequent urination, fever, chills, nausea, vomiting, or other illness. The patient stated that he understood these instructions and would comply with them. We told the patient that prostate biopsy pathology reports are usually available within 3-5 working days, unless a pathologic second opinion is required, which may take 7-14 days. We told him to contact us to check on the status of his biopsy if he has not heard from [place] within 7 days. The patient left the ultrasound examination room in stable condition.

## 2022-09-09 ENCOUNTER — Ambulatory Visit (HOSPITAL_COMMUNITY)
Admission: RE | Admit: 2022-09-09 | Discharge: 2022-09-09 | Disposition: A | Discharge status: Home / Self Care | Payer: Medicare Other | Source: Ambulatory Visit | Attending: Urology | Admitting: Urology

## 2022-09-09 ENCOUNTER — Other Ambulatory Visit: Payer: Self-pay | Admitting: Urology

## 2022-09-09 ENCOUNTER — Encounter (HOSPITAL_COMMUNITY): Payer: Self-pay

## 2022-09-09 ENCOUNTER — Ambulatory Visit (HOSPITAL_BASED_OUTPATIENT_CLINIC_OR_DEPARTMENT_OTHER): Payer: Medicare Other | Admitting: Urology

## 2022-09-09 ENCOUNTER — Encounter: Payer: Self-pay | Admitting: Urology

## 2022-09-09 DIAGNOSIS — R972 Elevated prostate specific antigen [PSA]: Secondary | ICD-10-CM | POA: Diagnosis not present

## 2022-09-09 MED ORDER — GENTAMICIN SULFATE 40 MG/ML IJ SOLN
INTRAMUSCULAR | Status: AC
  Filled 2022-09-09: qty 4

## 2022-09-09 MED ORDER — LIDOCAINE HCL (PF) 2 % IJ SOLN
INTRAMUSCULAR | Status: AC
  Filled 2022-09-09: qty 10

## 2022-09-09 MED ORDER — GENTAMICIN SULFATE 40 MG/ML IJ SOLN: 160.0000 mg | Freq: Once | INTRAMUSCULAR | Status: DC

## 2022-09-09 NOTE — Progress Notes (Signed)
Prostate Biopsy complete no signs of  distress.  

## 2022-09-15 ENCOUNTER — Other Ambulatory Visit: Payer: Self-pay | Admitting: Urology

## 2022-09-15 DIAGNOSIS — C61 Malignant neoplasm of prostate: Secondary | ICD-10-CM

## 2022-09-16 ENCOUNTER — Telehealth: Payer: Self-pay

## 2022-09-16 NOTE — Telephone Encounter (Signed)
Daughter notified of prostate biopsy discussion appt for April 16th.

## 2022-09-16 NOTE — Telephone Encounter (Signed)
-----   Message from Franchot Gallo, MD sent at 09/15/2022  8:35 AM EDT ----- I called and spoke w/ daughter --he needs CT A/P (order put in) as well as PCa conference w/me ----- Message ----- From: Sherrilyn Rist, CMA Sent: 09/12/2022   8:36 AM EDT To: Franchot Gallo, MD  Please review

## 2022-09-17 DIAGNOSIS — E782 Mixed hyperlipidemia: Secondary | ICD-10-CM | POA: Diagnosis not present

## 2022-09-17 DIAGNOSIS — Z9181 History of falling: Secondary | ICD-10-CM | POA: Diagnosis not present

## 2022-09-17 DIAGNOSIS — I25810 Atherosclerosis of coronary artery bypass graft(s) without angina pectoris: Secondary | ICD-10-CM | POA: Diagnosis not present

## 2022-09-17 DIAGNOSIS — I10 Essential (primary) hypertension: Secondary | ICD-10-CM | POA: Diagnosis not present

## 2022-09-17 DIAGNOSIS — R7303 Prediabetes: Secondary | ICD-10-CM | POA: Diagnosis not present

## 2022-09-17 DIAGNOSIS — E039 Hypothyroidism, unspecified: Secondary | ICD-10-CM | POA: Diagnosis not present

## 2022-09-17 DIAGNOSIS — E559 Vitamin D deficiency, unspecified: Secondary | ICD-10-CM | POA: Diagnosis not present

## 2022-09-17 DIAGNOSIS — M170 Bilateral primary osteoarthritis of knee: Secondary | ICD-10-CM | POA: Diagnosis not present

## 2022-09-18 DIAGNOSIS — I25810 Atherosclerosis of coronary artery bypass graft(s) without angina pectoris: Secondary | ICD-10-CM | POA: Diagnosis not present

## 2022-09-18 DIAGNOSIS — E782 Mixed hyperlipidemia: Secondary | ICD-10-CM | POA: Diagnosis not present

## 2022-09-18 DIAGNOSIS — M170 Bilateral primary osteoarthritis of knee: Secondary | ICD-10-CM | POA: Diagnosis not present

## 2022-09-18 DIAGNOSIS — R42 Dizziness and giddiness: Secondary | ICD-10-CM | POA: Diagnosis not present

## 2022-09-18 DIAGNOSIS — Z0001 Encounter for general adult medical examination with abnormal findings: Secondary | ICD-10-CM | POA: Diagnosis not present

## 2022-09-18 DIAGNOSIS — E559 Vitamin D deficiency, unspecified: Secondary | ICD-10-CM | POA: Diagnosis not present

## 2022-09-18 DIAGNOSIS — I10 Essential (primary) hypertension: Secondary | ICD-10-CM | POA: Diagnosis not present

## 2022-09-18 DIAGNOSIS — R0609 Other forms of dyspnea: Secondary | ICD-10-CM | POA: Diagnosis not present

## 2022-09-18 DIAGNOSIS — R300 Dysuria: Secondary | ICD-10-CM | POA: Diagnosis not present

## 2022-09-18 DIAGNOSIS — R7303 Prediabetes: Secondary | ICD-10-CM | POA: Diagnosis not present

## 2022-09-23 DIAGNOSIS — I25810 Atherosclerosis of coronary artery bypass graft(s) without angina pectoris: Secondary | ICD-10-CM | POA: Diagnosis not present

## 2022-09-23 DIAGNOSIS — E559 Vitamin D deficiency, unspecified: Secondary | ICD-10-CM | POA: Diagnosis not present

## 2022-09-23 DIAGNOSIS — I10 Essential (primary) hypertension: Secondary | ICD-10-CM | POA: Diagnosis not present

## 2022-09-23 DIAGNOSIS — Z9181 History of falling: Secondary | ICD-10-CM | POA: Diagnosis not present

## 2022-09-23 DIAGNOSIS — M170 Bilateral primary osteoarthritis of knee: Secondary | ICD-10-CM | POA: Diagnosis not present

## 2022-09-23 DIAGNOSIS — E039 Hypothyroidism, unspecified: Secondary | ICD-10-CM | POA: Diagnosis not present

## 2022-09-23 DIAGNOSIS — E782 Mixed hyperlipidemia: Secondary | ICD-10-CM | POA: Diagnosis not present

## 2022-09-23 DIAGNOSIS — R7303 Prediabetes: Secondary | ICD-10-CM | POA: Diagnosis not present

## 2022-09-30 DIAGNOSIS — M170 Bilateral primary osteoarthritis of knee: Secondary | ICD-10-CM | POA: Diagnosis not present

## 2022-09-30 DIAGNOSIS — E039 Hypothyroidism, unspecified: Secondary | ICD-10-CM | POA: Diagnosis not present

## 2022-09-30 DIAGNOSIS — E782 Mixed hyperlipidemia: Secondary | ICD-10-CM | POA: Diagnosis not present

## 2022-09-30 DIAGNOSIS — R7303 Prediabetes: Secondary | ICD-10-CM | POA: Diagnosis not present

## 2022-09-30 DIAGNOSIS — I25810 Atherosclerosis of coronary artery bypass graft(s) without angina pectoris: Secondary | ICD-10-CM | POA: Diagnosis not present

## 2022-09-30 DIAGNOSIS — E559 Vitamin D deficiency, unspecified: Secondary | ICD-10-CM | POA: Diagnosis not present

## 2022-09-30 DIAGNOSIS — Z9181 History of falling: Secondary | ICD-10-CM | POA: Diagnosis not present

## 2022-09-30 DIAGNOSIS — I10 Essential (primary) hypertension: Secondary | ICD-10-CM | POA: Diagnosis not present

## 2022-10-01 ENCOUNTER — Ambulatory Visit (HOSPITAL_COMMUNITY)
Admission: RE | Admit: 2022-10-01 | Discharge: 2022-10-01 | Disposition: A | Discharge status: Home / Self Care | Payer: Medicare Other | Source: Ambulatory Visit | Attending: Urology | Admitting: Urology

## 2022-10-01 ENCOUNTER — Encounter (HOSPITAL_COMMUNITY): Payer: Self-pay

## 2022-10-01 DIAGNOSIS — C61 Malignant neoplasm of prostate: Secondary | ICD-10-CM

## 2022-10-01 DIAGNOSIS — K4090 Unilateral inguinal hernia, without obstruction or gangrene, not specified as recurrent: Secondary | ICD-10-CM | POA: Diagnosis not present

## 2022-10-01 DIAGNOSIS — K8020 Calculus of gallbladder without cholecystitis without obstruction: Secondary | ICD-10-CM | POA: Diagnosis not present

## 2022-10-01 DIAGNOSIS — N281 Cyst of kidney, acquired: Secondary | ICD-10-CM | POA: Diagnosis not present

## 2022-10-01 MED ORDER — IOHEXOL 300 MG/ML  SOLN
100.0000 mL | Freq: Once | INTRAMUSCULAR | Status: AC | PRN
  Administered 2022-10-01: 100 mL via INTRAVENOUS

## 2022-10-07 ENCOUNTER — Ambulatory Visit: Payer: Medicare Other | Admitting: Urology

## 2022-10-07 VITALS — BP 109/39 | HR 64

## 2022-10-07 DIAGNOSIS — C61 Malignant neoplasm of prostate: Secondary | ICD-10-CM

## 2022-10-07 DIAGNOSIS — R350 Frequency of micturition: Secondary | ICD-10-CM | POA: Diagnosis not present

## 2022-10-07 MED ORDER — MIRABEGRON ER 50 MG PO TB24: 50.0000 mg | ORAL_TABLET | Freq: Every day | ORAL | 3 refills | Status: DC

## 2022-10-07 NOTE — Progress Notes (Signed)
History of Present Illness: Corey Glover is here today for discussion on his newly diagnosed prostate cancer.  3.19.2024: TRUS/Bx. PSA 7.3, prostate volume 30 mL, PSAD 0.24. 1/12 cores (right apex medial) revealed GS 4+3 pattern in 50% of core.    Past Medical History:  Diagnosis Date   BPH (benign prostatic hyperplasia)    CAD in native artery    Hyperlipidemia     Past Surgical History:  Procedure Laterality Date   COLONOSCOPY WITH PROPOFOL N/A 08/16/2020   Procedure: COLONOSCOPY WITH PROPOFOL;  Surgeon: Lanelle Bal, DO;  [facility];  Service: Endoscopy;  Laterality: N/A;  10:15am, per office - pt not able to come earlier due to transportation   CORONARY ARTERY BYPASS GRAFT     1997 6 vessel (Dr. Laneta Simmers)   KNEE SURGERY     leg as a child    Home Medications:  Allergies as of 10/07/2022       Reactions   Crestor [rosuvastatin]    Couldn't walk   Simvastatin         Medication List        Accurate as of October 07, 2022  8:50 AM. If you have any questions, ask your nurse or doctor.          albuterol 108 (90 Base) MCG/ACT inhaler Commonly known as: VENTOLIN HFA Inhale 2 puffs into the lungs every 6 (six) hours as needed.   aspirin EC 81 MG tablet Take 81 mg by mouth daily.   BLUE-EMU MAXIMUM STRENGTH EX Apply topically.   cholecalciferol 25 MCG (1000 UNIT) tablet Commonly known as: VITAMIN D3 Take 1,000 Units by mouth daily.   meclizine 25 MG tablet Commonly known as: ANTIVERT Take 25 mg by mouth 3 (three) times daily as needed for dizziness or nausea.   metoprolol succinate 25 MG 24 hr tablet Commonly known as: TOPROL-XL Take 12.5 mg by mouth daily.   pravastatin 40 MG tablet Commonly known as: PRAVACHOL Take 40 mg by mouth daily.   sulfamethoxazole-trimethoprim 800-160 MG tablet Commonly known as: BACTRIM DS Take 1 tablet by mouth 2 (two) times daily.   tamsulosin 0.4 MG Caps capsule Commonly known as: FLOMAX Take 0.4  mg by mouth daily.        Allergies:  Allergies  Allergen Reactions   Crestor [Rosuvastatin]     Couldn't walk   Simvastatin     Family History  Problem Relation Age of Onset   Heart disease Father    Heart disease Brother    Colon cancer Neg Hx     Social History:  reports that he has never smoked. He has never used smokeless tobacco. He reports that he does not drink alcohol and does not use drugs.  ROS: A complete review of systems was performed.  All systems are negative except for pertinent findings as noted.  Physical Exam:  Vital signs in last 24 hours: There were no vitals taken for this visit. Constitutional:  Alert and oriented, No acute distress Cardiovascular: Regular rate  Respiratory: Normal respiratory effort GI: Abdomen is soft, nontender, nondistended, no abdominal masses. No CVAT.  Genitourinary: Normal male phallus, testes are descended bilaterally and non-tender and without masses, scrotum is normal in appearance without lesions or masses, perineum is normal on inspection. Lymphatic: No lymphadenopathy Neurologic: Grossly intact, no focal deficits Psychiatric: Normal mood and affect  I have reviewed prior pt notes   I have reviewed urinalysis results  I have independently reviewed prior  imaging  I have reviewed prior PSA results    Impression/Assessment:  GG 3 PCa  Plan:  The patient (and family member present) was/were counseled about the natural history of prostate cancer and the standard treatment options that are available for prostate cancer. It was explained to him how his age and life expectancy, clinical stage, Gleason score, and PSA affect his prognosis, the decision to proceed with additional staging studies, as well as how that information influences recommended treatment strategies. We discussed the roles for active surveillance, radiation therapy, surgical therapy, androgen deprivation, as well as ablative therapy options for the  treatment of prostate cancer as appropriate to his individual cancer situation. We discussed the risks and benefits of these options with regard to their impact on cancer control and also in terms of potential adverse events, complications, and impact on quality of life particularly related to urinary, bowel, and sexual function. The patient was encouraged to ask questions throughout the discussion today and all questions were answered to his stated satisfaction. In addition, the patient was provided with and/or directed to appropriate resources and literature for further education about prostate cancer and treatment options.   Based on patient's age and preferences, I think he is a best candidate for radiotherapy.  We will get him set up for radiotherapy consultation.

## 2022-10-16 NOTE — Progress Notes (Signed)
GU Location of Tumor / Histology: Prostate Ca  If Prostate Cancer, Gleason Score is (4 + 3) and PSA is (7.3 on 09/09/2022)  Biopsy     10/01/2022 Dr. [name] CT Abdomen Pelvis with Contrast CLINICAL DATA: Prostate cancer. Staging. Elevated PSA.   FINDINGS: Lower chest: No acute abnormality. Signs of previous median sternotomy and CABG.   Hepatobiliary: No suspicious liver abnormality. Two small stones noted within the gallbladder neck measure up to 3 mm. No gallbladder wall thickening or inflammation.   Pancreas: Unremarkable. No pancreatic ductal dilatation or surrounding inflammatory changes.   Spleen: Normal in size without focal abnormality.   Adrenals/Urinary Tract: Normal adrenal glands. No signs of nephrolithiasis, hydronephrosis or suspicious mass. Bilateral parapelvic cysts are identified. The largest is on the left extending medially measuring 4.7 cm. No follow-up imaging recommended. Urinary bladder appears normal.   Stomach/Bowel: Stomach is within normal limits. No bowel wall thickening, inflammation or distension. The appendix appears diminutive. No signs of appendicitis.   Vascular/Lymphatic: Aortic atherosclerotic calcifications identified. No aneurysm. No signs of abdominopelvic adenopathy.   Reproductive: Prostate gland measures 4.3 x 4.2 by 4.6 cm (volume = 43 cm^3). Increased enhancement within the anterior portion of the gland identified. Symmetric appearance of the seminal vesicles.   Other: No ascites.  Bilateral fat containing inguinal hernias.   Musculoskeletal: No acute or suspicious osseous bone lesions. Mild chronic anterior superior endplate deformity is noted the T12 level.   IMPRESSION: 1. No acute findings within the abdomen or pelvis. No signs of nodal or solid organ metastasis. 2. Mild prostate gland enlargement with increased enhancement within the anterior portion of the gland. 3. Bilateral fat containing inguinal hernias. 4.  Cholelithiasis. 5. Aortic Atherosclerosis (ICD10-I70.0).   Past/Anticipated interventions by urology, if any: NA  Past/Anticipated interventions by medical oncology, if any:  NA  Weight changes, if any: No  IPSS:  19 SHIM:  5  Bowel/Bladder complaints, if any:  No  Nausea/Vomiting, if any: No  Pain issues, if any:  0/10  SAFETY ISSUES: Prior radiation?  No Pacemaker/ICD? No Possible current pregnancy? Male Is the patient on methotrexate? No  Current Complaints / other details:

## 2022-10-21 ENCOUNTER — Ambulatory Visit
Admission: RE | Admit: 2022-10-21 | Discharge: 2022-10-21 | Disposition: A | Discharge status: Home / Self Care | Payer: Medicare Other | Source: Ambulatory Visit | Attending: Radiation Oncology | Admitting: Radiation Oncology

## 2022-10-21 ENCOUNTER — Encounter: Payer: Self-pay | Admitting: Radiation Oncology

## 2022-10-21 ENCOUNTER — Other Ambulatory Visit: Payer: Self-pay

## 2022-10-21 VITALS — BP 125/75 | HR 66 | Temp 97.1°F | Resp 18 | Ht 66.0 in | Wt 161.4 lb

## 2022-10-21 DIAGNOSIS — I10 Essential (primary) hypertension: Secondary | ICD-10-CM | POA: Insufficient documentation

## 2022-10-21 DIAGNOSIS — Z79899 Other long term (current) drug therapy: Secondary | ICD-10-CM | POA: Diagnosis not present

## 2022-10-21 DIAGNOSIS — Z923 Personal history of irradiation: Secondary | ICD-10-CM | POA: Insufficient documentation

## 2022-10-21 DIAGNOSIS — Z7982 Long term (current) use of aspirin: Secondary | ICD-10-CM | POA: Insufficient documentation

## 2022-10-21 DIAGNOSIS — Z191 Hormone sensitive malignancy status: Secondary | ICD-10-CM | POA: Diagnosis not present

## 2022-10-21 DIAGNOSIS — I2510 Atherosclerotic heart disease of native coronary artery without angina pectoris: Secondary | ICD-10-CM | POA: Insufficient documentation

## 2022-10-21 DIAGNOSIS — E039 Hypothyroidism, unspecified: Secondary | ICD-10-CM | POA: Insufficient documentation

## 2022-10-21 DIAGNOSIS — K4020 Bilateral inguinal hernia, without obstruction or gangrene, not specified as recurrent: Secondary | ICD-10-CM | POA: Diagnosis not present

## 2022-10-21 DIAGNOSIS — C61 Malignant neoplasm of prostate: Secondary | ICD-10-CM

## 2022-10-21 DIAGNOSIS — K8020 Calculus of gallbladder without cholecystitis without obstruction: Secondary | ICD-10-CM | POA: Diagnosis not present

## 2022-10-21 DIAGNOSIS — M1990 Unspecified osteoarthritis, unspecified site: Secondary | ICD-10-CM | POA: Insufficient documentation

## 2022-10-21 DIAGNOSIS — I700 Atherosclerosis of aorta: Secondary | ICD-10-CM | POA: Insufficient documentation

## 2022-10-21 DIAGNOSIS — E785 Hyperlipidemia, unspecified: Secondary | ICD-10-CM | POA: Insufficient documentation

## 2022-10-21 DIAGNOSIS — Z79890 Hormone replacement therapy (postmenopausal): Secondary | ICD-10-CM | POA: Diagnosis not present

## 2022-10-21 HISTORY — DX: Essential (primary) hypertension: I10

## 2022-10-21 HISTORY — DX: Overactive bladder: N32.81

## 2022-10-21 HISTORY — DX: Unspecified atherosclerosis: I70.90

## 2022-10-21 HISTORY — DX: Bilateral primary osteoarthritis of knee: M17.0

## 2022-10-21 HISTORY — DX: Prediabetes: R73.03

## 2022-10-21 HISTORY — DX: Dizziness and giddiness: R42

## 2022-10-21 HISTORY — DX: Hypothyroidism, unspecified: E03.9

## 2022-10-21 NOTE — Progress Notes (Signed)
Radiation Oncology         [phone] ________________________________  Initial Outpatient Consultation  Name: Corey Glover MRN: [id number]  Date: 10/21/2022  DOB: 26-Oct-1945  WU:JWJX, [name], MD  [name], MD   REFERRING PHYSICIAN: [name], MD  DIAGNOSIS: 77 y.o. gentleman with Stage T1c adenocarcinoma of the prostate with Gleason score of 4+3, and PSA of 7.3.    ICD-10-CM   1. Malignant neoplasm of prostate (HCC)  C61       HISTORY OF PRESENT ILLNESS: Corey Glover is a 77 y.o. male with a diagnosis of prostate cancer. He has a history of BPH with LUTS, managed with Flomax for years with his PCP. More recently, he was noted to have an elevated PSA of 23 by his primary care physician, Dr. Margo Aye.  Accordingly, he was referred for evaluation in urology by Dr. Retta Diones on 06/10/22,  digital rectal examination performed at that time showed no abnormalities. He was treated with a course of culture directed Bactrim for staph UTI. He returned for a repeat PSA on 07/22/22, which showed a decrease but remained elevated above normal, at 7.3. Therefore, the patient proceeded to transrectal ultrasound with 12 biopsies of the prostate on 09/09/22.  The prostate volume measured 30 cc.  Out of 12 core biopsies, only 1 was positive.  The maximum Gleason score was 4+3, and this was seen in the right apex.  He underwent staging CT A/P on 10/01/22 showing no evidence of nodal or solid organ metastasis; mild prostate enlargement with increased enhancement within the anterior portion.  The patient reviewed the biopsy and imaging results with his urologist and he has kindly been referred today for discussion of potential radiation treatment options.   PREVIOUS RADIATION THERAPY: No  PAST MEDICAL HISTORY:  Past Medical History:  Diagnosis Date   Atherosclerosis    BPH (benign prostatic hyperplasia)    CAD in native artery    Dizziness    Hyperlipidemia    Hypertension     Hypothyroidism    Osteoarthritis of both knees    Overactive bladder    Prediabetes       PAST SURGICAL HISTORY: Past Surgical History:  Procedure Laterality Date   COLONOSCOPY WITH PROPOFOL N/A 08/16/2020   Procedure: COLONOSCOPY WITH PROPOFOL;  Surgeon: Lanelle Bal, DO;  Location[facility];  Service: Endoscopy;  Laterality: N/A;  10:15am, per office - pt not able to come earlier due to transportation   CORONARY ARTERY BYPASS GRAFT     1997 6 vessel (Dr. Laneta Simmers)   KNEE SURGERY     leg as a child   PROSTATE BIOPSY      FAMILY HISTORY:  Family History  Problem Relation Age of Onset   Heart disease Father    Heart disease Brother    Colon cancer Neg Hx     SOCIAL HISTORY:  Social History   Socioeconomic History   Marital status: Single    Spouse name: Not on file   Number of children: Not on file   Years of education: Not on file   Highest education level: Not on file  Occupational History   Not on file  Tobacco Use   Smoking status: Never   Smokeless tobacco: Never  Vaping Use   Vaping Use: Never used  Substance and Sexual Activity   Alcohol use: No   Drug use: No   Sexual activity: Not Currently  Other Topics Concern   Not on file  Social History Narrative   Divorced since 2000.Son lives with him.Retired ,used to work in Publishing rights manager.Ex-military.   Social Determinants of Health   Financial Resource Strain: Not on file  Food Insecurity: No Food Insecurity (10/21/2022)   Hunger Vital Sign    Worried About Running Out of Food in the Last Year: Never true    Ran Out of Food in the Last Year: Never true  Transportation Needs: No Transportation Needs (10/21/2022)   PRAPARE - Administrator, Civil Service (Medical): No    Lack of Transportation (Non-Medical): No  Physical Activity: Not on file  Stress: Not on file  Social Connections: Not on file  Intimate Partner Violence: Not At Risk (10/21/2022)   Humiliation, Afraid, Rape, and  Kick questionnaire    Fear of Current or Ex-Partner: No    Emotionally Abused: No    Physically Abused: No    Sexually Abused: No    ALLERGIES: Crestor [rosuvastatin] and Simvastatin  MEDICATIONS:  Current Outpatient Medications  Medication Sig Dispense Refill   atorvastatin (LIPITOR) 80 MG tablet Take 80 mg by mouth daily.     levothyroxine (SYNTHROID) 25 MCG tablet Take 25 mcg by mouth daily.     albuterol (VENTOLIN HFA) 108 (90 Base) MCG/ACT inhaler Inhale 2 puffs into the lungs every 6 (six) hours as needed.     aspirin EC 81 MG tablet Take 81 mg by mouth daily.     cholecalciferol (VITAMIN D3) 25 MCG (1000 UNIT) tablet Take 1,000 Units by mouth daily.     meclizine (ANTIVERT) 25 MG tablet Take 25 mg by mouth 3 (three) times daily as needed for dizziness or nausea.     Menthol, Topical Analgesic, (BLUE-EMU MAXIMUM STRENGTH EX) Apply topically.     metoprolol succinate (TOPROL-XL) 25 MG 24 hr tablet Take 12.5 mg by mouth daily.     mirabegron ER (MYRBETRIQ) 50 MG TB24 tablet Take 1 tablet (50 mg total) by mouth daily. 90 tablet 3   tamsulosin (FLOMAX) 0.4 MG CAPS capsule Take 0.4 mg by mouth daily.     No current facility-administered medications for this encounter.    REVIEW OF SYSTEMS:  On review of systems, the patient reports that he is doing well overall. He denies any chest pain, shortness of breath, cough, fevers, chills, night sweats, unintended weight changes. He denies any bowel disturbances, and denies abdominal pain, nausea or vomiting. He denies any new musculoskeletal or joint aches or pains. His IPSS was 19, indicating moderate-severe irritative urinary symptoms with urinary frequency, urgency and nocturia x4. No obstructive voiding symptoms reported. He continues taking Flomax daily and was recently started on Myrbetriq 50 mg daily approximately 2 weeks ago, with improvement. His SHIM was 5, indicating he has severe erectile dysfunction. A complete review of systems is  obtained and is otherwise negative.    PHYSICAL EXAM:  Wt Readings from Last 3 Encounters:  10/21/22 161 lb 6 oz (73.2 kg)  04/30/22 165 lb (74.8 kg)  08/07/21 167 lb (75.8 kg)   Temp Readings from Last 3 Encounters:  10/21/22 (!) 97.1 F (36.2 C) (Temporal)  09/09/22 97.7 F (36.5 C) (Oral)  04/30/22 98.6 F (37 C) (Oral)   BP Readings from Last 3 Encounters:  10/21/22 125/75  10/07/22 (!) 109/39  09/09/22 131/71   Pulse Readings from Last 3 Encounters:  10/21/22 66  10/07/22 64  09/09/22 74   Pain Assessment Pain Score: 0-No pain/10  In general this is a well  appearing Caucasian male in no acute distress. He's alert and oriented x4 and appropriate throughout the examination. Cardiopulmonary assessment is negative for acute distress, and he exhibits normal effort.     KPS = 100  100 - Normal; no complaints; no evidence of disease. 90   - Able to carry on normal activity; minor signs or symptoms of disease. 80   - Normal activity with effort; some signs or symptoms of disease. 40   - Cares for self; unable to carry on normal activity or to do active work. 60   - Requires occasional assistance, but is able to care for most of his personal needs. 50   - Requires considerable assistance and frequent medical care. 40   - Disabled; requires special care and assistance. 30   - Severely disabled; hospital admission is indicated although death not imminent. 20   - Very sick; hospital admission necessary; active supportive treatment necessary. 10   - Moribund; fatal processes progressing rapidly. 0     - Dead  Karnofsky DA, Abelmann WH, Craver LS and Burchenal [facility] [phone]) The use of the nitrogen mustards in the palliative treatment of carcinoma: with particular reference to bronchogenic carcinoma Cancer 1 634-56  LABORATORY DATA:  Lab Results  Component Value Date   WBC 12.4 (H) 04/30/2022   HGB 15.7 04/30/2022   HCT 46.5 04/30/2022   MCV 87.1 04/30/2022   PLT 223  04/30/2022   Lab Results  Component Value Date   NA 136 04/30/2022   K 3.8 04/30/2022   CL 103 04/30/2022   CO2 23 04/30/2022   Lab Results  Component Value Date   ALT 29 04/30/2022   AST 30 04/30/2022   ALKPHOS 44 04/30/2022   BILITOT 0.9 04/30/2022     RADIOGRAPHY: CT ABDOMEN PELVIS W CONTRAST  Result Date: 10/03/2022 CLINICAL DATA:  Prostate cancer.  Staging.  Elevated PSA. * Tracking Code: BO * EXAM: CT ABDOMEN AND PELVIS WITH CONTRAST TECHNIQUE: Multidetector CT imaging of the abdomen and pelvis was performed using the standard protocol following bolus administration of intravenous contrast. RADIATION DOSE REDUCTION: This exam was performed according to the departmental dose-optimization program which includes automated exposure control, adjustment of the mA and/or kV according to patient size and/or use of iterative reconstruction technique. CONTRAST:  OMNIPAQUE IOHEXOL 300 MG/ML  SOLN COMPARISON:  None Available. FINDINGS: Lower chest: No acute abnormality. Signs of previous median sternotomy and CABG. Hepatobiliary: No suspicious liver abnormality. Two small stones noted within the gallbladder neck measure up to 3 mm. No gallbladder wall thickening or inflammation. Pancreas: Unremarkable. No pancreatic ductal dilatation or surrounding inflammatory changes. Spleen: Normal in size without focal abnormality. Adrenals/Urinary Tract: Normal adrenal glands. No signs of nephrolithiasis, hydronephrosis or suspicious mass. Bilateral parapelvic cysts are identified. The largest is on the left extending medially measuring 4.7 cm. No follow-up imaging recommended. Urinary bladder appears normal. Stomach/Bowel: Stomach is within normal limits. No bowel wall thickening, inflammation or distension. The appendix appears diminutive. No signs of appendicitis. Vascular/Lymphatic: Aortic atherosclerotic calcifications identified. No aneurysm. No signs of abdominopelvic adenopathy. Reproductive: Prostate  gland measures 4.3 x 4.2 by 4.6 cm (volume = 43 cm^3). Increased enhancement within the anterior portion of the gland identified. Symmetric appearance of the seminal vesicles. Other: No ascites.  Bilateral fat containing inguinal hernias. Musculoskeletal: No acute or suspicious osseous bone lesions. Mild chronic anterior superior endplate deformity is noted the T12 level. IMPRESSION: 1. No acute findings within the abdomen or pelvis. No signs  of nodal or solid organ metastasis. 2. Mild prostate gland enlargement with increased enhancement within the anterior portion of the gland. 3. Bilateral fat containing inguinal hernias. 4. Cholelithiasis. 5. Aortic Atherosclerosis (ICD10-I70.0). Electronically Signed   By: Signa Kell M.D.   On: 10/03/2022 08:16      IMPRESSION/PLAN: 1. 77 y.o. gentleman with Stage T1c adenocarcinoma of the prostate with Gleason Score of 4+3, and PSA of 7.3. We discussed the patient's workup and outlined the nature of prostate cancer in this setting. The patient's T stage, Gleason's score, and PSA put him into the intermediate risk group. Accordingly, he is eligible for a variety of potential treatment options including brachytherapy, 5.5 weeks of external radiation, or prostatectomy. We discussed the available radiation techniques, and focused on the details and logistics of delivery. We discussed and outlined the risks, benefits, short and long-term effects associated with radiotherapy and compared and contrasted these with prostatectomy. We discussed the role of SpaceOAR gel in reducing the rectal toxicity associated with radiotherapy. He appears to have a good understanding of his disease and our treatment recommendations which are of curative intent.  He was encouraged to ask questions that were answered to his stated satisfaction.  At the conclusion of our conversation, the patient is interested in moving forward with brachytherapy and use of SpaceOAR gel to reduce rectal  toxicity from radiotherapy.  We will share our discussion with Dr. Retta Diones and move forward with scheduling his CT [facility] planning appointment in the near future.  The patient met briefly with Darryl Nestle in our office who will be working closely with him to coordinate OR scheduling and pre and post procedure appointments.  We will contact the pharmaceutical rep to ensure that SpaceOAR is available at the time of procedure.  We enjoyed meeting him today and look forward to continuing to participate in his care. . We personally spent 70 minutes in this encounter including chart review, reviewing radiological studies, meeting face-to-face with the patient, entering orders and completing documentation.    [name], PA-C    [name], MD  [facility]  Radiation Oncology Direct Dial: [phone]  Fax: [phone] [email or web address]  Skype  LinkedIn

## 2022-10-27 ENCOUNTER — Telehealth: Payer: Self-pay | Admitting: *Deleted

## 2022-10-27 NOTE — Telephone Encounter (Signed)
Called patient's daughter- Lossie Faes to update, lvm for a return call

## 2022-11-03 ENCOUNTER — Other Ambulatory Visit: Payer: Self-pay | Admitting: Urology

## 2022-11-07 NOTE — Progress Notes (Signed)
RN left voicemail with patient's daughter, Efraim Kaufmann, to introduce self and explain role to help identify any barriers to care.

## 2022-11-19 ENCOUNTER — Telehealth: Payer: Self-pay | Admitting: *Deleted

## 2022-11-19 ENCOUNTER — Ambulatory Visit: Payer: Medicare Other | Admitting: Radiation Oncology

## 2022-11-19 NOTE — Progress Notes (Signed)
  Radiation Oncology         (336) [phone] ________________________________  Name: Corey Glover MRN: [id number]  Date: 11/20/2022  DOB: 1945/12/05  SIMULATION AND TREATMENT PLANNING NOTE PUBIC ARCH STUDY  JO:ACZY, [name], MD  [name], MD  DIAGNOSIS: 77 y.o. gentleman with Stage T1c adenocarcinoma of the prostate with Gleason score of 4+3, and PSA of 7.3.   Oncology History  Malignant neoplasm of prostate (HCC)  09/09/2022 Cancer Staging   Staging form: Prostate, AJCC 8th Edition - Clinical stage from 09/09/2022: Stage IIC (cT1c, cN0, cM0, PSA: 7.3, Grade Group: 3) - Signed by Marcello Fennel, PA-C on 11/19/2022 Histopathologic type: Adenocarcinoma, NOS Stage prefix: Initial diagnosis Prostate specific antigen (PSA) range: Less than 10 Gleason primary pattern: 4 Gleason secondary pattern: 3 Gleason score: 7 Histologic grading system: 5 grade system Number of biopsy cores examined: 12 Number of biopsy cores positive: 1 Location of positive needle core biopsies: One side   10/21/2022 Initial Diagnosis   Malignant neoplasm of prostate (HCC)       ICD-10-CM   1. Malignant neoplasm of prostate (HCC)  C61       COMPLEX SIMULATION:  The patient presented today for evaluation for possible prostate seed implant. He was brought to the radiation planning suite and placed supine on the CT couch. A 3-dimensional image study set was obtained in upload to the planning computer. There, on each axial slice, I contoured the prostate gland. Then, using three-dimensional radiation planning tools I reconstructed the prostate in view of the structures from the transperineal needle pathway to assess for possible pubic arch interference. In doing so, I did not appreciate any pubic arch interference. Also, the patient's prostate volume was estimated based on the drawn structure. The volume was 30 cc.  Given the pubic arch appearance and prostate volume, patient remains a good candidate to  proceed with prostate seed implant. Today, he freely provided informed written consent to proceed.    PLAN: The patient will undergo prostate seed implant.   ________________________________  Artist Pais. Kathrynn Running, M.D.

## 2022-11-19 NOTE — Telephone Encounter (Signed)
CALLED PATIENT TO REMIND OF PRE-SEED APPTS. FOR 11-20-22, LVM FOR A RETURN CALL

## 2022-11-19 NOTE — Progress Notes (Signed)
Radiation Oncology         (336) [phone] ________________________________  Outpatient Follow up- Pre-seed visit  Name: Corey Glover MRN: [id number]  Date: 11/20/2022  DOB: 10/26/1945  XB:JYNW, [name], MD  Corey Matar, MD   REFERRING PHYSICIAN: [name], MD  DIAGNOSIS: 77 y.o. gentleman with Stage T1c adenocarcinoma of the prostate with Gleason score of 4+3, and PSA of 7.3.     ICD-10-CM   1. Malignant neoplasm of prostate (HCC)  C61       HISTORY OF PRESENT ILLNESS: [name] is a 77 y.o. male with a diagnosis of prostate cancer. He has a history of BPH with LUTS, managed with Flomax for years with his PCP. More recently, he was noted to have an elevated PSA of 23 by his primary care physician, Dr. Margo Glover.  Accordingly, he was referred for evaluation in urology by Dr. Retta Glover on 06/10/22,  digital rectal examination performed at that time showed no abnormalities. He was treated with a course of culture directed Bactrim for staph UTI. He returned for a repeat PSA on 07/22/22, which showed a decrease but remained elevated above normal, at 7.3. Therefore, the patient proceeded to transrectal ultrasound with 12 biopsies of the prostate on 09/09/22.  The prostate volume measured 30 cc.  Out of 12 core biopsies, only 1 was positive.  The maximum Gleason score was 4+3, and this was seen in the right apex.   He underwent staging CT A/P on 10/01/22 showing no evidence of nodal or solid organ metastasis; mild prostate enlargement with increased enhancement within the anterior portion.  The patient reviewed the biopsy results with his urologist and was kindly referred to [place] for discussion of potential radiation treatment options. We initially met the patient on 10/21/22 and he was most interested in proceeding with brachytherapy and SpaceOAR gel placement for treatment of his disease. He is here today for his pre-procedure imaging for planning and to answer any additional  questions he may have about this treatment.   PREVIOUS RADIATION THERAPY: No  PAST MEDICAL HISTORY:  Past Medical History:  Diagnosis Date   Atherosclerosis    BPH (benign prostatic hyperplasia)    CAD in native artery    Dizziness    Hyperlipidemia    Hypertension    Hypothyroidism    Osteoarthritis of both knees    Overactive bladder    Prediabetes       PAST SURGICAL HISTORY: Past Surgical History:  Procedure Laterality Date   COLONOSCOPY WITH PROPOFOL N/A 08/16/2020   Procedure: COLONOSCOPY WITH PROPOFOL;  Surgeon: Corey Bal, DO;  [facility];  Service: Endoscopy;  Laterality: N/A;  10:15am, per office - pt not able to come earlier due to transportation   CORONARY ARTERY BYPASS GRAFT     1997 6 vessel (Dr. Laneta Glover)   KNEE SURGERY     leg as a child   PROSTATE BIOPSY      FAMILY HISTORY:  Family History  Problem Relation Age of Onset   Heart disease Father    Heart disease Brother    Colon cancer Neg Hx     SOCIAL HISTORY:  Social History   Socioeconomic History   Marital status: Single    Spouse name: Not on file   Number of children: Not on file   Years of education: Not on file   Highest education level: Not on file  Occupational History   Not on file  Tobacco Use  Smoking status: Never   Smokeless tobacco: Never  Vaping Use   Vaping Use: Never used  Substance and Sexual Activity   Alcohol use: No   Drug use: No   Sexual activity: Not Currently  Other Topics Concern   Not on file  Social History Narrative   Divorced since 2000.Son lives with him.Retired ,used to work in Publishing rights manager.Ex-military.   Social Determinants of Health   Financial Resource Strain: Not on file  Food Insecurity: No Food Insecurity (10/21/2022)   Hunger Vital Sign    Worried About Running Out of Food in the Last Year: Never true    Ran Out of Food in the Last Year: Never true  Transportation Needs: No Transportation Needs (10/21/2022)   PRAPARE  - Administrator, Civil Service (Medical): No    Lack of Transportation (Non-Medical): No  Physical Activity: Not on file  Stress: Not on file  Social Connections: Not on file  Intimate Partner Violence: Not At Risk (10/21/2022)   Humiliation, Afraid, Rape, and Kick questionnaire    Fear of Current or Ex-Partner: No    Emotionally Abused: No    Physically Abused: No    Sexually Abused: No    ALLERGIES: Crestor [rosuvastatin] and Simvastatin  MEDICATIONS:  Current Outpatient Medications  Medication Sig Dispense Refill   albuterol (VENTOLIN HFA) 108 (90 Base) MCG/ACT inhaler Inhale 2 puffs into the lungs every 6 (six) hours as needed.     aspirin EC 81 MG tablet Take 81 mg by mouth daily.     atorvastatin (LIPITOR) 80 MG tablet Take 80 mg by mouth daily.     cholecalciferol (VITAMIN D3) 25 MCG (1000 UNIT) tablet Take 1,000 Units by mouth daily.     levothyroxine (SYNTHROID) 25 MCG tablet Take 25 mcg by mouth daily.     meclizine (ANTIVERT) 25 MG tablet Take 25 mg by mouth 3 (three) times daily as needed for dizziness or nausea.     Menthol, Topical Analgesic, (BLUE-EMU MAXIMUM STRENGTH EX) Apply topically.     metoprolol succinate (TOPROL-XL) 25 MG 24 hr tablet Take 12.5 mg by mouth daily.     mirabegron ER (MYRBETRIQ) 50 MG TB24 tablet Take 1 tablet (50 mg total) by mouth daily. 90 tablet 3   tamsulosin (FLOMAX) 0.4 MG CAPS capsule Take 0.4 mg by mouth daily.     No current facility-administered medications for this visit.    REVIEW OF SYSTEMS:  On review of systems, the patient reports that he is doing well overall. He denies any chest pain, shortness of breath, cough, fevers, chills, night sweats, unintended weight changes. He denies any bowel disturbances, and denies abdominal pain, nausea or vomiting. He denies any new musculoskeletal or joint aches or pains. His IPSS was 19, indicating moderate-severe irritative urinary symptoms with urinary frequency, urgency and  nocturia x4. No obstructive voiding symptoms reported. He continues taking Flomax daily and was recently started on Myrbetriq 50 mg daily approximately 2 weeks ago, with improvement. His SHIM was 5, indicating he has severe erectile dysfunction. A complete review of systems is obtained and is otherwise negative.    PHYSICAL EXAM:  Wt Readings from Last 3 Encounters:  10/21/22 161 lb 6 oz (73.2 kg)  04/30/22 165 lb (74.8 kg)  08/07/21 167 lb (75.8 kg)   Temp Readings from Last 3 Encounters:  10/21/22 (!) 97.1 F (36.2 C) (Temporal)  09/09/22 97.7 F (36.5 C) (Oral)  04/30/22 98.6 F (37 C) (Oral)  BP Readings from Last 3 Encounters:  10/21/22 125/75  10/07/22 (!) 109/39  09/09/22 131/71   Pulse Readings from Last 3 Encounters:  10/21/22 66  10/07/22 64  09/09/22 74    /10  In general this is a well appearing Caucasian male in no acute distress. He's alert and oriented x4 and appropriate throughout the examination. Cardiopulmonary assessment is negative for acute distress, and he exhibits normal effort.     KPS = 100  100 - Normal; no complaints; no evidence of disease. 90   - Able to carry on normal activity; minor signs or symptoms of disease. 80   - Normal activity with effort; some signs or symptoms of disease. 49   - Cares for self; unable to carry on normal activity or to do active work. 60   - Requires occasional assistance, but is able to care for most of his personal needs. 50   - Requires considerable assistance and frequent medical care. 40   - Disabled; requires special care and assistance. 30   - Severely disabled; hospital admission is indicated although death not imminent. 20   - Very sick; hospital admission necessary; active supportive treatment necessary. 10   - Moribund; fatal processes progressing rapidly. 0     - Dead  Karnofsky DA, Abelmann WH, Craver LS and [facility] [phone]) The use of the nitrogen mustards in the palliative treatment of carcinoma:  with particular reference to bronchogenic carcinoma Cancer 1 634-56  LABORATORY DATA:  Lab Results  Component Value Date   WBC 12.4 (H) 04/30/2022   HGB 15.7 04/30/2022   HCT 46.5 04/30/2022   MCV 87.1 04/30/2022   PLT 223 04/30/2022   Lab Results  Component Value Date   NA 136 04/30/2022   K 3.8 04/30/2022   CL 103 04/30/2022   CO2 23 04/30/2022   Lab Results  Component Value Date   ALT 29 04/30/2022   AST 30 04/30/2022   ALKPHOS 44 04/30/2022   BILITOT 0.9 04/30/2022     RADIOGRAPHY: No results found.    IMPRESSION/PLAN: 1. 77 y.o. gentleman with Stage T1c adenocarcinoma of the prostate with Gleason score of 4+3, and PSA of 7.3.  The patient has elected to proceed with seed implant for treatment of his disease. We reviewed the risks, benefits, short and long-term effects associated with brachytherapy and discussed the role of SpaceOAR in reducing the rectal toxicity associated with radiotherapy.  He appears to have a good understanding of his disease and our treatment recommendations which are of curative intent.  He was encouraged to ask questions that were answered to his stated satisfaction. He has freely signed written consent to proceed today in the office and a copy of this document will be placed in his medical record. His procedure is tentatively scheduled for 12/19/22 in collaboration with Dr. Retta Glover and we will see him back for his post-procedure visit approximately 3 weeks thereafter. We look forward to continuing to participate in his care. He knows that he is welcome to call with any questions or concerns at any time in the interim.  I personally spent 20 minutes in this encounter including chart review, reviewing radiological studies, meeting face-to-face with the patient, entering orders and completing documentation.    [name], MMS, PA-C [place]  [facility] Radiation Oncology Physician Assistant Direct Dial: [phone]   Fax: [phone]

## 2022-11-20 ENCOUNTER — Ambulatory Visit
Admission: RE | Admit: 2022-11-20 | Discharge: 2022-11-20 | Disposition: A | Discharge status: Home / Self Care | Payer: Medicare Other | Source: Ambulatory Visit | Attending: Radiation Oncology | Admitting: Radiation Oncology

## 2022-11-20 ENCOUNTER — Ambulatory Visit
Admission: RE | Admit: 2022-11-20 | Discharge: 2022-11-20 | Disposition: A | Discharge status: Home / Self Care | Payer: Medicare Other | Source: Ambulatory Visit | Attending: Urology | Admitting: Urology

## 2022-11-20 ENCOUNTER — Encounter (HOSPITAL_COMMUNITY)
Admission: RE | Admit: 2022-11-20 | Discharge: 2022-11-20 | Disposition: A | Discharge status: Home / Self Care | Payer: Medicare Other | Source: Ambulatory Visit | Attending: Radiation Oncology | Admitting: Radiation Oncology

## 2022-11-20 ENCOUNTER — Encounter: Payer: Self-pay | Admitting: Urology

## 2022-11-20 VITALS — Resp 18 | Ht 66.0 in | Wt 160.0 lb

## 2022-11-20 DIAGNOSIS — C61 Malignant neoplasm of prostate: Secondary | ICD-10-CM

## 2022-11-20 DIAGNOSIS — Z191 Hormone sensitive malignancy status: Secondary | ICD-10-CM | POA: Diagnosis not present

## 2022-11-20 DIAGNOSIS — Z01818 Encounter for other preprocedural examination: Secondary | ICD-10-CM

## 2022-11-20 DIAGNOSIS — Z01810 Encounter for preprocedural cardiovascular examination: Secondary | ICD-10-CM | POA: Insufficient documentation

## 2022-11-20 DIAGNOSIS — R001 Bradycardia, unspecified: Secondary | ICD-10-CM | POA: Insufficient documentation

## 2022-11-20 NOTE — Progress Notes (Addendum)
Pre-seed nursing interview for a diagnosis of Stage T1c adenocarcinoma of the prostate with Gleason score of 4+3, and PSA of 7.3.  Patient identity verified x2. Patient reports doing well. No issues conveyed at this time.  Meaningful use complete.  Urinary Management medication(s)- Tamsulosin Urology appointment date- 11/2022, with Dr. Retta Diones at [facility] [place]  Resp 18   Ht 5\' 6"  (1.676 m)   Wt 160 lb (72.6 kg)   BMI 25.82 kg/m   This concludes the interview.   Ruel Favors, LPN

## 2022-12-08 ENCOUNTER — Other Ambulatory Visit: Payer: Self-pay

## 2022-12-08 ENCOUNTER — Encounter (HOSPITAL_BASED_OUTPATIENT_CLINIC_OR_DEPARTMENT_OTHER): Payer: Self-pay | Admitting: Urology

## 2022-12-08 NOTE — Progress Notes (Addendum)
Spoke w/ via phone for pre-op interview---pt Lab needs dos----   I stat            Lab results------EKG 11-20-2022 epic  stress test 02-07-2017 epic, lov dr Eden Emms 01-23-2017 epic COVID test -----patient  daughter melissastates asymptomatic no test needed Arrive at -------530 am 12-19-2022 NPO after MN NO Solid Food.  Clear liquids from MN until---430 am Med rec completed Medications to take morning of surgery -----mybetriq, levothyroxine, metoprolol succinate Diabetic medication -----n/a Patient instructed no nail polish to be worn day of surgery Patient instructed to bring photo id and insurance card day of surgery Patient aware to have Driver (ride ) / caregiver   daughter Timothy Lasso  for 24 hours after surgery  Patient Special Instructions -----fleets enema am of surgery Pre-Op special Instructions -----none Patient verbalized understanding of instructions that were given at this phone interview. Patient denies shortness of breath, chest pain, fever, cough at this phone interview.  Addendum: reviewed pt history with dr t brock mda, pt ok for 12-19-2022 surgery at [facility] per dr t brock mda  Patient with mild memory loss, pt signs own consents per poa daughter Lossie Faes [facility] richardson to bring copy of poa day of surgery)  Call son caydon feasel [phone] for post op phone call

## 2022-12-18 ENCOUNTER — Telehealth: Payer: Self-pay | Admitting: *Deleted

## 2022-12-18 NOTE — Progress Notes (Signed)
Radiation Oncology         (336) [phone] ________________________________  Name: Corey Glover MRN: [id number]  Date: 12/19/2022  DOB: [birth year]       Prostate Seed Implant  NW:GNFA, Corey Hazel, MD  No ref. provider found  DIAGNOSIS:  77 y.o. gentleman with Stage T1c adenocarcinoma of the prostate with Gleason score of 4+3, and PSA of 7.3.   Oncology History  Malignant neoplasm of prostate (HCC)  09/09/2022 Cancer Staging   Staging form: Prostate, AJCC 8th Edition - Clinical stage from 09/09/2022: Stage IIC (cT1c, cN0, cM0, PSA: 7.3, Grade Group: 3) - Signed by Marcello Fennel, PA-C on 11/19/2022 Histopathologic type: Adenocarcinoma, NOS Stage prefix: Initial diagnosis Prostate specific antigen (PSA) range: Less than 10 Gleason primary pattern: 4 Gleason secondary pattern: 3 Gleason score: 7 Histologic grading system: 5 grade system Number of biopsy cores examined: 12 Number of biopsy cores positive: 1 Location of positive needle core biopsies: One side   10/21/2022 Initial Diagnosis   Malignant neoplasm of prostate (HCC)       ICD-10-CM   1. Pre-op testing  Z01.818 CBG per Guidelines for Diabetes Management for Patients Undergoing Surgery ([facility], [facility], and [facility] only)    CBG per protocol    I-Stat, Chem 8 on day of surgery per protocol    CANCELED: EKG 12 lead per protocol      PROCEDURE: Insertion of radioactive I-125 seeds into the prostate gland.  RADIATION DOSE: 145 Gy, definitive therapy.  TECHNIQUE: Corey Glover was brought to the operating room with the urologist. He was placed in the dorsolithotomy position. He was catheterized and a rectal tube was inserted. The perineum was shaved, prepped and draped. The ultrasound probe was then introduced by me into the rectum to see the prostate gland.  TREATMENT DEVICE: I attached the needle grid to the ultrasound probe stand and anchor needles were placed.  3D PLANNING: The prostate was imaged in 3D using a sagittal sweep  of the prostate probe. These images were transferred to the planning computer. There, the prostate, urethra and rectum were defined on each axial reconstructed image. Then, the software created an optimized 3D plan and a few seed positions were adjusted. The quality of the plan was reviewed using [facility] information for the target and the following two organs at risk:  Urethra and Rectum.  Then the accepted plan was printed and handed off to the radiation therapist.  Under my supervision, the custom loading of the seeds and spacers was carried out using the quick loader.  These pre-loaded needles were then placed into the needle holder.Marland Kitchen  PROSTATE VOLUME STUDY:  Using transrectal ultrasound the volume of the prostate was verified to be 33.7 cc.  SPECIAL TREATMENT PROCEDURE/SUPERVISION AND HANDLING: The pre-loaded needles were then delivered by the urologist under sagittal guidance. A total of 19 needles were used to deposit 63 seeds in the prostate gland. The individual seed activity was 0.396 mCi.  SpaceOAR:  Yes  COMPLEX SIMULATION: At the end of the procedure, an anterior radiograph of the pelvis was obtained to document seed positioning and count. Cystoscopy was performed by the urologist to check the urethra and bladder.  MICRODOSIMETRY: At the end of the procedure, the patient was emitting 0.084 mR/hr at 1 meter. Accordingly, he was considered safe for hospital discharge.  PLAN: The patient will return to the radiation oncology clinic for post implant CT dosimetry in three weeks.   ________________________________  Artist Pais Kathrynn Running, M.D.

## 2022-12-18 NOTE — Telephone Encounter (Signed)
CALLED PATIENT'S DAUGHTER- MELISSA RICHARDSON TO REMIND OF PROCEDURE FOR 12-19-22, SPOKE WITH PATIENT'S DAUGHTER MELISSA RICHARDSON AND SHE IS AWARE OF THIS PROCEDURE

## 2022-12-18 NOTE — Anesthesia Preprocedure Evaluation (Addendum)
Anesthesia Evaluation  Patient identified by MRN, date of birth, ID band Patient awake    Reviewed: Allergy & Precautions, NPO status , Patient's Chart, lab work & pertinent test results  Airway Mallampati: II  TM Distance: >3 FB Neck ROM: Full    Dental no notable dental hx. (+) Teeth Intact, Dental Advisory Given   Pulmonary neg pulmonary ROS   Pulmonary exam normal breath sounds clear to auscultation       Cardiovascular hypertension, Pt. on medications and Pt. on home beta blockers + CAD  Normal cardiovascular exam Rhythm:Regular Rate:Normal     Neuro/Psych  Neuromuscular disease    GI/Hepatic   Endo/Other  Hypothyroidism    Renal/GU Lab Results      Component                Value               Date                      CREATININE               0.90                12/19/2022                BUN                      15                  12/19/2022                NA                       140                 12/19/2022                K                        4.0                 12/19/2022                CL                       107                 12/19/2022                   Prostate CA    Musculoskeletal  (+) Arthritis ,    Abdominal   Peds  Hematology Lab Results      Component                Value               Date                              HGB                      15.6                12/19/2022                HCT  46.0                12/19/2022                   Anesthesia Other Findings All: crestor, simvastatin  Reproductive/Obstetrics                             Anesthesia Physical Anesthesia Plan  ASA: 3  Anesthesia Plan: General   Post-op Pain Management: Precedex   Induction: Intravenous  PONV Risk Score and Plan: 2 and Treatment may vary due to age or medical condition and Ondansetron  Airway Management Planned: Oral ETT  Additional  Equipment: None  Intra-op Plan:   Post-operative Plan: Extubation in OR  Informed Consent:      Dental advisory given  Plan Discussed with:   Anesthesia Plan Comments:         Anesthesia Quick Evaluation

## 2022-12-19 ENCOUNTER — Encounter (HOSPITAL_BASED_OUTPATIENT_CLINIC_OR_DEPARTMENT_OTHER)
Admission: RE | Disposition: A | Discharge status: Home / Self Care | Payer: Self-pay | Source: Ambulatory Visit | Attending: Urology

## 2022-12-19 ENCOUNTER — Other Ambulatory Visit: Payer: Self-pay

## 2022-12-19 ENCOUNTER — Ambulatory Visit (HOSPITAL_BASED_OUTPATIENT_CLINIC_OR_DEPARTMENT_OTHER)
Admission: RE | Admit: 2022-12-19 | Discharge: 2022-12-19 | Disposition: A | Discharge status: Home / Self Care | Payer: Medicare Other | Source: Ambulatory Visit | Attending: Urology | Admitting: Urology

## 2022-12-19 ENCOUNTER — Ambulatory Visit (HOSPITAL_BASED_OUTPATIENT_CLINIC_OR_DEPARTMENT_OTHER): Payer: Medicare Other | Admitting: Anesthesiology

## 2022-12-19 ENCOUNTER — Encounter (HOSPITAL_BASED_OUTPATIENT_CLINIC_OR_DEPARTMENT_OTHER): Payer: Self-pay | Admitting: Urology

## 2022-12-19 ENCOUNTER — Ambulatory Visit (HOSPITAL_COMMUNITY): Payer: Medicare Other

## 2022-12-19 DIAGNOSIS — Z01818 Encounter for other preprocedural examination: Secondary | ICD-10-CM

## 2022-12-19 DIAGNOSIS — Z79899 Other long term (current) drug therapy: Secondary | ICD-10-CM | POA: Diagnosis not present

## 2022-12-19 DIAGNOSIS — I10 Essential (primary) hypertension: Secondary | ICD-10-CM

## 2022-12-19 DIAGNOSIS — E785 Hyperlipidemia, unspecified: Secondary | ICD-10-CM | POA: Diagnosis not present

## 2022-12-19 DIAGNOSIS — I2510 Atherosclerotic heart disease of native coronary artery without angina pectoris: Secondary | ICD-10-CM | POA: Insufficient documentation

## 2022-12-19 DIAGNOSIS — Z191 Hormone sensitive malignancy status: Secondary | ICD-10-CM | POA: Diagnosis not present

## 2022-12-19 DIAGNOSIS — E039 Hypothyroidism, unspecified: Secondary | ICD-10-CM | POA: Insufficient documentation

## 2022-12-19 DIAGNOSIS — C61 Malignant neoplasm of prostate: Secondary | ICD-10-CM | POA: Insufficient documentation

## 2022-12-19 DIAGNOSIS — Z510 Encounter for antineoplastic radiation therapy: Secondary | ICD-10-CM | POA: Diagnosis not present

## 2022-12-19 HISTORY — PX: RADIOACTIVE SEED IMPLANT: SHX5150

## 2022-12-19 HISTORY — DX: Malignant neoplasm of prostate: C61

## 2022-12-19 HISTORY — DX: Dependence on other enabling machines and devices: Z99.89

## 2022-12-19 HISTORY — PX: SPACE OAR INSTILLATION: SHX6769

## 2022-12-19 HISTORY — DX: Mild cognitive impairment of uncertain or unknown etiology: G31.84

## 2022-12-19 HISTORY — DX: Presence of spectacles and contact lenses: Z97.3

## 2022-12-19 LAB — POCT I-STAT, CHEM 8
BUN: 15 mg/dL (ref 8–23)
Calcium, Ion: 1.22 mmol/L (ref 1.15–1.40)
Chloride: 107 mmol/L (ref 98–111)
Creatinine, Ser: 0.9 mg/dL (ref 0.61–1.24)
Glucose, Bld: 112 mg/dL — ABNORMAL HIGH (ref 70–99)
HCT: 46 % (ref 39.0–52.0)
Hemoglobin: 15.6 g/dL (ref 13.0–17.0)
Potassium: 4 mmol/L (ref 3.5–5.1)
Sodium: 140 mmol/L (ref 135–145)
TCO2: 21 mmol/L — ABNORMAL LOW (ref 22–32)

## 2022-12-19 SURGERY — INSERTION, RADIATION SOURCE, PROSTATE
Anesthesia: General | Site: Prostate

## 2022-12-19 MED ORDER — PROPOFOL 500 MG/50ML IV EMUL
INTRAVENOUS | Status: DC | PRN
  Administered 2022-12-19: 175 ug/kg/min via INTRAVENOUS
  Administered 2022-12-19: 150 ug/kg/min via INTRAVENOUS

## 2022-12-19 MED ORDER — ONDANSETRON HCL 4 MG/2ML IJ SOLN
INTRAMUSCULAR | Status: DC | PRN
  Administered 2022-12-19: 4 mg via INTRAVENOUS

## 2022-12-19 MED ORDER — STERILE WATER FOR IRRIGATION IR SOLN
Status: DC | PRN
  Administered 2022-12-19: 3000 mL

## 2022-12-19 MED ORDER — FENTANYL CITRATE (PF) 100 MCG/2ML IJ SOLN
INTRAMUSCULAR | Status: AC
  Filled 2022-12-19: qty 2

## 2022-12-19 MED ORDER — PROPOFOL 1000 MG/100ML IV EMUL
INTRAVENOUS | Status: AC
  Filled 2022-12-19: qty 100

## 2022-12-19 MED ORDER — FENTANYL CITRATE (PF) 100 MCG/2ML IJ SOLN: 25.0000 ug | INTRAMUSCULAR | Status: DC | PRN

## 2022-12-19 MED ORDER — DEXMEDETOMIDINE HCL IN NACL 80 MCG/20ML IV SOLN
INTRAVENOUS | Status: DC | PRN
  Administered 2022-12-19: 8 ug via INTRAVENOUS

## 2022-12-19 MED ORDER — IOHEXOL 300 MG/ML  SOLN
INTRAMUSCULAR | Status: DC | PRN
  Administered 2022-12-19: 7 mL via URETHRAL

## 2022-12-19 MED ORDER — KETOROLAC TROMETHAMINE 30 MG/ML IJ SOLN
INTRAMUSCULAR | Status: DC | PRN
  Administered 2022-12-19: 15 mg via INTRAVENOUS

## 2022-12-19 MED ORDER — PROPOFOL 10 MG/ML IV BOLUS
INTRAVENOUS | Status: AC
  Filled 2022-12-19: qty 20

## 2022-12-19 MED ORDER — DEXAMETHASONE SODIUM PHOSPHATE 10 MG/ML IJ SOLN
INTRAMUSCULAR | Status: DC | PRN
  Administered 2022-12-19: 4 mg via INTRAVENOUS

## 2022-12-19 MED ORDER — SODIUM CHLORIDE 0.9 % IR SOLN
Status: DC | PRN
  Administered 2022-12-19: 1000 mL

## 2022-12-19 MED ORDER — DEXMEDETOMIDINE HCL IN NACL 80 MCG/20ML IV SOLN
INTRAVENOUS | Status: AC
  Filled 2022-12-19: qty 20

## 2022-12-19 MED ORDER — LACTATED RINGERS IV SOLN: INTRAVENOUS | Status: DC

## 2022-12-19 MED ORDER — ONDANSETRON HCL 4 MG/2ML IJ SOLN
INTRAMUSCULAR | Status: AC
  Filled 2022-12-19: qty 2

## 2022-12-19 MED ORDER — ROCURONIUM BROMIDE 10 MG/ML (PF) SYRINGE
PREFILLED_SYRINGE | INTRAVENOUS | Status: AC
  Filled 2022-12-19: qty 10

## 2022-12-19 MED ORDER — EPHEDRINE SULFATE (PRESSORS) 50 MG/ML IJ SOLN
INTRAMUSCULAR | Status: DC | PRN
  Administered 2022-12-19: 5 mg via INTRAVENOUS
  Administered 2022-12-19: 10 mg via INTRAVENOUS
  Administered 2022-12-19: 5 mg via INTRAVENOUS

## 2022-12-19 MED ORDER — CEFAZOLIN SODIUM-DEXTROSE 2-4 GM/100ML-% IV SOLN
2.0000 g | Freq: Once | INTRAVENOUS | Status: AC
  Administered 2022-12-19: 2 g via INTRAVENOUS

## 2022-12-19 MED ORDER — FENTANYL CITRATE (PF) 100 MCG/2ML IJ SOLN
INTRAMUSCULAR | Status: DC | PRN
  Administered 2022-12-19: 25 ug via INTRAVENOUS

## 2022-12-19 MED ORDER — FLEET ENEMA 7-19 GM/118ML RE ENEM: 1.0000 | ENEMA | Freq: Once | RECTAL | Status: DC

## 2022-12-19 MED ORDER — SODIUM CHLORIDE (PF) 0.9 % IJ SOLN
INTRAMUSCULAR | Status: DC | PRN
  Administered 2022-12-19: 10 mL

## 2022-12-19 MED ORDER — ACETAMINOPHEN 10 MG/ML IV SOLN: 1000.0000 mg | Freq: Once | INTRAVENOUS | Status: DC | PRN

## 2022-12-19 MED ORDER — ONDANSETRON HCL 4 MG/2ML IJ SOLN: 4.0000 mg | Freq: Once | INTRAMUSCULAR | Status: DC | PRN

## 2022-12-19 MED ORDER — CEFAZOLIN SODIUM-DEXTROSE 2-4 GM/100ML-% IV SOLN
INTRAVENOUS | Status: AC
  Filled 2022-12-19: qty 100

## 2022-12-19 MED ORDER — SUGAMMADEX SODIUM 200 MG/2ML IV SOLN
INTRAVENOUS | Status: DC | PRN
  Administered 2022-12-19: 150 mg via INTRAVENOUS

## 2022-12-19 MED ORDER — LIDOCAINE HCL (PF) 2 % IJ SOLN
INTRAMUSCULAR | Status: AC
  Filled 2022-12-19: qty 5

## 2022-12-19 MED ORDER — PROPOFOL 10 MG/ML IV BOLUS
INTRAVENOUS | Status: DC | PRN
  Administered 2022-12-19: 120 mg via INTRAVENOUS
  Administered 2022-12-19: 20 mg via INTRAVENOUS

## 2022-12-19 MED ORDER — LIDOCAINE 2% (20 MG/ML) 5 ML SYRINGE
INTRAMUSCULAR | Status: DC | PRN
  Administered 2022-12-19: 100 mg via INTRAVENOUS

## 2022-12-19 MED ORDER — KETOROLAC TROMETHAMINE 30 MG/ML IJ SOLN
INTRAMUSCULAR | Status: AC
  Filled 2022-12-19: qty 1

## 2022-12-19 MED ORDER — DEXAMETHASONE SODIUM PHOSPHATE 10 MG/ML IJ SOLN
INTRAMUSCULAR | Status: AC
  Filled 2022-12-19: qty 1

## 2022-12-19 MED ORDER — ROCURONIUM BROMIDE 10 MG/ML (PF) SYRINGE
PREFILLED_SYRINGE | INTRAVENOUS | Status: DC | PRN
  Administered 2022-12-19: 55 mg via INTRAVENOUS

## 2022-12-19 SURGICAL SUPPLY — 48 items
BAG DRN RND TRDRP ANRFLXCHMBR (UROLOGICAL SUPPLIES) ×1
BAG URINE DRAIN 2000ML AR STRL (UROLOGICAL SUPPLIES) ×1 IMPLANT
BLADE CLIPPER SENSICLIP SURGIC (BLADE) ×1 IMPLANT
BLANKET WARM UPPER BOD BAIR (MISCELLANEOUS) ×1 IMPLANT
CATH FOLEY 2WAY SLVR 5CC 16FR (CATHETERS) ×1 IMPLANT
CATH ROBINSON RED A/P 16FR (CATHETERS) IMPLANT
CATH ROBINSON RED A/P 20FR (CATHETERS) ×1 IMPLANT
CLOTH BEACON ORANGE TIMEOUT ST (SAFETY) ×1 IMPLANT
CNTNR URN SCR LID CUP LEK RST (MISCELLANEOUS) ×1 IMPLANT
CONT SPEC 4OZ STRL OR WHT (MISCELLANEOUS) ×1
COVER BACK TABLE 60X90IN (DRAPES) ×1 IMPLANT
COVER MAYO STAND STRL (DRAPES) ×1 IMPLANT
DRSG TEGADERM 4X4.75 (GAUZE/BANDAGES/DRESSINGS) ×1 IMPLANT
DRSG TEGADERM 8X12 (GAUZE/BANDAGES/DRESSINGS) ×1 IMPLANT
GAUZE SPONGE 4X4 12PLY STRL (GAUZE/BANDAGES/DRESSINGS) IMPLANT
GEL ULTRASOUND 20GR AQUASONIC (MISCELLANEOUS) ×2 IMPLANT
GLOVE BIO SURGEON STRL SZ 6.5 (GLOVE) IMPLANT
GLOVE BIO SURGEON STRL SZ7.5 (GLOVE) IMPLANT
GLOVE BIO SURGEON STRL SZ8 (GLOVE) ×2 IMPLANT
GLOVE BIOGEL PI IND STRL 6.5 (GLOVE) IMPLANT
GLOVE BIOGEL PI IND STRL 7.0 (GLOVE) IMPLANT
GLOVE SURG ORTHO 8.5 STRL (GLOVE) ×1 IMPLANT
GOWN STRL REUS W/TWL XL LVL3 (GOWN DISPOSABLE) ×1 IMPLANT
GRID BRACH TEMP 18GA 2.8X3X.75 (MISCELLANEOUS) ×1 IMPLANT
HOLDER FOLEY CATH W/STRAP (MISCELLANEOUS) ×1 IMPLANT
I-125 seeds IMPLANT
IMPL SPACEOAR VUE SYSTEM (Spacer) ×1 IMPLANT
IMPLANT SPACEOAR VUE SYSTEM (Spacer) ×1 IMPLANT
IV NS 1000ML (IV SOLUTION) ×1
IV NS 1000ML BAXH (IV SOLUTION) ×1 IMPLANT
KIT TURNOVER CYSTO (KITS) ×1 IMPLANT
NDL BRACHY 18G 5PK (NEEDLE) ×4 IMPLANT
NDL BRACHY 18G SINGLE (NEEDLE) IMPLANT
NDL PK MORGANSTERN STABILIZ (NEEDLE) ×1 IMPLANT
NEEDLE BRACHY 18G 5PK (NEEDLE) ×4 IMPLANT
NEEDLE BRACHY 18G SINGLE (NEEDLE) IMPLANT
NEEDLE PK MORGANSTERN STABILIZ (NEEDLE) ×1 IMPLANT
PACK CYSTO (CUSTOM PROCEDURE TRAY) ×1 IMPLANT
SHEATH ULTRASOUND LF (SHEATH) IMPLANT
SHEATH ULTRASOUND LTX NONSTRL (SHEATH) IMPLANT
SLEEVE SCD COMPRESS KNEE MED (STOCKING) ×1 IMPLANT
SUT BONE WAX W31G (SUTURE) IMPLANT
SYR 10ML LL (SYRINGE) ×1 IMPLANT
SYR CONTROL 10ML LL (SYRINGE) ×1 IMPLANT
TOWEL OR 17X24 6PK STRL BLUE (TOWEL DISPOSABLE) ×1 IMPLANT
UNDERPAD 30X36 HEAVY ABSORB (UNDERPADS AND DIAPERS) ×2 IMPLANT
WATER STERILE IRR 3000ML UROMA (IV SOLUTION) IMPLANT
WATER STERILE IRR 500ML POUR (IV SOLUTION) ×1 IMPLANT

## 2022-12-19 NOTE — Transfer of Care (Signed)
Immediate Anesthesia Transfer of Care Note  Patient: Corey Glover  Procedure(s) Performed: RADIOACTIVE SEED IMPLANT/BRACHYTHERAPY IMPLANT (Prostate) SPACE OAR INSTILLATION (Prostate)  Patient Location: PACU  Anesthesia Type:General  Level of Consciousness: sedated  Airway & Oxygen Therapy: Patient Spontanous Breathing and Patient connected to nasal cannula oxygen  Post-op Assessment: Report given to RN  Post vital signs: Reviewed and stable  Last Vitals:  Vitals Value Taken Time  BP 103/59 12/19/22 0852  Temp    Pulse 67 12/19/22 0853  Resp 16 12/19/22 0853  SpO2 94 % 12/19/22 0853  Vitals shown include unvalidated device data.  Last Pain:  Vitals:   12/19/22 0548  TempSrc: Oral         Complications: No notable events documented.

## 2022-12-19 NOTE — H&P (Signed)
H&P  Chief Complaint: PCa  History of Present Illness: 77 yo male presents for I 125 brachytherapy/SpaceOAR for treatment of GG 3 PCa  Past Medical History:  Diagnosis Date   Atherosclerosis    BPH (benign prostatic hyperplasia)    CAD in native artery    Dizziness    occ, no meds taken   Hyperlipidemia    Hypertension    Hypothyroidism    Mild cognitive impairment with memory loss    pt signs own concents per poa daughter Lossie Faes   Osteoarthritis of both knees    Overactive bladder    Prostate cancer (HCC)    Uses walker    Wears glasses     Past Surgical History:  Procedure Laterality Date   COLONOSCOPY WITH PROPOFOL N/A 08/16/2020   Procedure: COLONOSCOPY WITH PROPOFOL;  Surgeon: Lanelle Bal, DO;  [facility];  Service: Endoscopy;  Laterality: N/A;  10:15am, per office - pt not able to come earlier due to transportation   CORONARY ARTERY BYPASS GRAFT     1997 6 vessel (Dr. Laneta Simmers)   leg surgery as a child     PROSTATE BIOPSY      Home Medications:    Allergies:  Allergies  Allergen Reactions   Crestor [Rosuvastatin]     Couldn't walk   Simvastatin     Family History  Problem Relation Age of Onset   Heart disease Father    Heart disease Brother    Colon cancer Neg Hx     Social History:  reports that he has never smoked. He has never used smokeless tobacco. He reports that he does not drink alcohol and does not use drugs.  ROS: A complete review of systems was performed.  All systems are negative except for pertinent findings as noted.  Physical Exam:  Vital signs in last 24 hours: BP 117/75   Pulse (!) 59   Temp 97.8 F (36.6 C) (Oral)   Resp 17   Ht 5\' 5"  (1.651 m)   Wt 72.8 kg   SpO2 98%   BMI 26.71 kg/m  Constitutional:  Alert and oriented, No acute distress Cardiovascular: Regular rate  Respiratory: Normal respiratory effort Neurologic: Grossly intact, no focal deficits Psychiatric: Normal mood and  affect  I have reviewed prior pt notes  I have reviewed urinalysis results  I have independently reviewed prior imaging  I have reviewed prior PSA/pathology results    Impression/Assessment:  Grade Group 3 PCA  Plan:  I 125 brachytherapy/SpaceOAR

## 2022-12-19 NOTE — Interval H&P Note (Signed)
History and Physical Interval Note:  12/19/2022 7:24 AM  Corey Glover  has presented today for surgery, with the diagnosis of PROSTATE CANCER.  The various methods of treatment have been discussed with the patient and family. After consideration of risks, benefits and other options for treatment, the patient has consented to  Procedure(s) with comments: RADIOACTIVE SEED IMPLANT/BRACHYTHERAPY IMPLANT (N/A) - 90 MINS SPACE OAR INSTILLATION (N/A) as a surgical intervention.  The patient's history has been reviewed, patient examined, no change in status, stable for surgery.  I have reviewed the patient's chart and labs.  Questions were answered to the patient's satisfaction.     Bertram Millard Sevan Mcbroom

## 2022-12-19 NOTE — Anesthesia Postprocedure Evaluation (Signed)
Anesthesia Post Note  Patient: Corey Glover  Procedure(s) Performed: RADIOACTIVE SEED IMPLANT/BRACHYTHERAPY IMPLANT (Prostate) SPACE OAR INSTILLATION (Prostate)     Patient location during evaluation: PACU Anesthesia Type: General Level of consciousness: awake and alert Pain management: pain level controlled Vital Signs Assessment: post-procedure vital signs reviewed and stable Respiratory status: spontaneous breathing, nonlabored ventilation, respiratory function stable and patient connected to nasal cannula oxygen Cardiovascular status: blood pressure returned to baseline and stable Postop Assessment: no apparent nausea or vomiting Anesthetic complications: no   No notable events documented.  Last Vitals:  Vitals:   12/19/22 0930 12/19/22 1006  BP: 110/72 108/71  Pulse: 62 (!) 58  Resp: 20 15  Temp: (!) 36.3 C (!) 36.3 C  SpO2: 92% 96%    Last Pain:  Vitals:   12/19/22 1006  TempSrc:   PainSc: 0-No pain                 Trevor Iha

## 2022-12-19 NOTE — Discharge Instructions (Addendum)
Radioactive Seed Implant Home Care Instructions   Activity:    Rest for the remainder of the day.  Do not drive or operate equipment today.  You may resume normal  activities in a few days as instructed by your physician, without risk of harmful radiation exposure to those around you, provided you follow the time and distance precautions on the Radiation Oncology Instruction Sheet.   Meals: Drink plenty of lipuids and eat light foods, such as gelatin or soup this evening .  You may return to normal meal plan tomorrow.  Return To Work: You may return to work as instructed by Designer, multimedia.  Special Instruction:   If any seeds are found, use tweezers to pick up seeds and place in a glass container of any kind and bring to your physician's office.  Call your physician if any of these symptoms occur:  Persistent or heavy bleeding Urine stream diminishes or stops completely after catheter is removed Fever equal to or greater than 101 degrees F Cloudy urine with a strong foul odor Severe pain  You may feel some burning pain and/or hesitancy when you urinate after the catheter is removed.  These symptoms may increase over the next few weeks, but should diminish within forur to six weeks.  Applying moist heat to the lower abdomen or a hot tub bath may help relieve the pain.  If the discomfort becomes severe, please call your physician for additional medications.      No ibuprofen, Advil, Aleve, Motrin, ketorolac, meloxicam, naproxen, or other NSAIDS until after 2:00 pm today if needed.   Post Anesthesia Home Care Instructions  Activity: Get plenty of rest for the remainder of the day. A responsible individual must stay with you for 24 hours following the procedure.  For the next 24 hours, DO NOT: -Drive a car -Advertising copywriter -Drink alcoholic beverages -Take any medication unless instructed by your physician -Make any legal decisions or sign important papers.  Meals: Start with  liquid foods such as gelatin or soup. Progress to regular foods as tolerated. Avoid greasy, spicy, heavy foods. If nausea and/or vomiting occur, drink only clear liquids until the nausea and/or vomiting subsides. Call your physician if vomiting continues.  Special Instructions/Symptoms: Your throat may feel dry or sore from the anesthesia or the breathing tube placed in your throat during surgery. If this causes discomfort, gargle with warm salt water. The discomfort should disappear within 24 hours.

## 2022-12-19 NOTE — Anesthesia Procedure Notes (Signed)
Procedure Name: Intubation Date/Time: 12/19/2022 7:42 AM  Performed by: Briant Sites, CRNAPre-anesthesia Checklist: Patient identified, Emergency Drugs available, Suction available and Patient being monitored Patient Re-evaluated:Patient Re-evaluated prior to induction Oxygen Delivery Method: Circle system utilized Preoxygenation: Pre-oxygenation with 100% oxygen Induction Type: IV induction Ventilation: Mask ventilation without difficulty Laryngoscope Size: Mac and 4 Grade View: Grade I Tube type: Oral Tube size: 7.5 mm Number of attempts: 1 Airway Equipment and Method: Stylet and Oral airway Placement Confirmation: ETT inserted through vocal cords under direct vision, positive ETCO2 and breath sounds checked- equal and bilateral Secured at: 22 cm Tube secured with: Tape Dental Injury: Teeth and Oropharynx as per pre-operative assessment

## 2022-12-19 NOTE — Op Note (Signed)
Preoperative diagnosis: Clinical stage TI C adenocarcinoma the prostate   Postoperative diagnosis: Same   Procedure: I-125 prostate seed implantation, SpaceOAR placement, flexible cystoscopy  Surgeon: Zyonna Vardaman M. Jovahn Breit M.D.  Radiation Oncologist: Matthew Manning, M.D.  Anesthesia: Gen.   Indications: Patient  was diagnosed with clinical stage TI C prostate cancer. We had extensive discussion with him about treatment options versus. He elected to proceed with seed implantation. He underwent consultation my office as well as with Dr. Manning. He appeared to understand the advantages disadvantages potential risks of this treatment option. Full informed consent has been obtained.   Technique and findings: Patient was brought the operating room where he had successful induction of general anesthesia. He was placed in dorso-lithotomy position and prepped and draped in usual manner. Appropriate surgical timeout was performed. Radiation oncology department placed a transrectal ultrasound probe anchoring stand. Foley catheter with contrast in the balloon was inserted without difficulty. Anchoring needles were placed within the prostate. Rectal tube was placed. Real-time contouring of the urethra prostate and rectum were performed and the dosing parameters were established. Targeted dose was 145 gray.  I was then called  to the operating suite suite for placement of the needles. A second timeout was performed. All needle passage was done with real-time transrectal ultrasound guidance with the sagittal plane. A total of 19 needles were placed.  63 active seeds were implanted.  I then proceeded with placement of SpaceOAR by introducing a needle with the bevel angled inferiorly approximately 2 cm superior to the anus. This was angled downward and under direct ultrasound was placed within the space between the prostatic capsule and rectum. This was confirmed with a small amount of sterile saline injected and  this was performed under direct ultrasound. I then attached the SpaceOAR to the needle and injected this in the space between the prostate and rectum with good placement noted. The Foley catheter was removed and flexible cystoscopy failed to show any seeds outside the prostate.  The patient was brought to recovery room in stable condition, having tolerated the procedure well..     

## 2022-12-22 ENCOUNTER — Encounter (HOSPITAL_BASED_OUTPATIENT_CLINIC_OR_DEPARTMENT_OTHER): Payer: Self-pay | Admitting: Urology

## 2022-12-23 ENCOUNTER — Telehealth: Payer: Self-pay | Admitting: *Deleted

## 2022-12-23 NOTE — Telephone Encounter (Signed)
Returned patient's daughter's phone call, spoke with patient's daughter- Lossie Faes

## 2022-12-24 ENCOUNTER — Encounter: Payer: Self-pay | Admitting: Urology

## 2022-12-24 NOTE — Progress Notes (Addendum)
Post-seed nursing interview for a diagnosis of Malignant neoplasm of prostate (HCC).  Patient identity verified x2. Patient reports doing well with the exception of some diarrhea advised to take some Imodium AD. Nothing more.  Meaningful use complete.  I-PSS score- 8 - Moderate SHIM score- 1 (No sexual activity within last 6 months) Urinary Management medication(s) Tamsulosin Urology appointment date- Pending with Dr. Marland KitchenPENDING" at [facility]  Vitals- 97.3-59-18-119/67 O2 98% Weight:  161.4 lbs.  This concludes the interaction.  Ruel Favors, LPN

## 2022-12-31 ENCOUNTER — Telehealth: Payer: Self-pay | Admitting: *Deleted

## 2022-12-31 NOTE — Telephone Encounter (Signed)
CALLED PATIENT'S DAUGHTER- MELISSA RICHARDSON TO REMIND OF [facility] POST SEED APPTS. FOR 01-02-23, LVM FOR A RETURN CALL

## 2023-01-02 ENCOUNTER — Ambulatory Visit
Admission: RE | Admit: 2023-01-02 | Discharge: 2023-01-02 | Disposition: A | Discharge status: Home / Self Care | Payer: Medicare Other | Source: Ambulatory Visit | Attending: Radiation Oncology | Admitting: Radiation Oncology

## 2023-01-02 ENCOUNTER — Ambulatory Visit
Admission: RE | Admit: 2023-01-02 | Discharge: 2023-01-02 | Disposition: A | Discharge status: Home / Self Care | Payer: Medicare Other | Source: Ambulatory Visit | Attending: Urology | Admitting: Urology

## 2023-01-02 VITALS — BP 119/67 | HR 59 | Temp 97.3°F | Resp 18 | Ht 65.0 in | Wt 161.4 lb

## 2023-01-02 DIAGNOSIS — C61 Malignant neoplasm of prostate: Secondary | ICD-10-CM

## 2023-01-02 NOTE — Progress Notes (Signed)
Radiation Oncology         [phone] ________________________________  Name: Corey Glover MRN: [id number]  Date: 01/02/2023  DOB: Nov 09, 1945  Post-Seed Follow-Up Visit Note  CC: Corey Stabile, MD  Corey Matar, MD  Diagnosis:    77 y.o. gentleman with Stage T1c adenocarcinoma of the prostate with Gleason score of 4+3, and PSA of 7.3.     ICD-10-CM   1. Malignant neoplasm of prostate (HCC)  C61       Interval Since Last Radiation:  2 weeks 12/19/22:  Insertion of radioactive I-125 seeds into the prostate gland; 145 Gy, definitive therapy with placement of SpaceOAR gel.  Narrative:  The patient returns today for routine follow-up.  He is complaining of increased urinary frequency and urinary hesitation symptoms. He filled out a questionnaire regarding urinary function today providing and overall IPSS score of 9 characterizing his symptoms as mild-moderate with nocturia x4/night, unchanged from prior to procedure.  His pre-implant score was 19 but he has had significant improvement since starting Flomax. He has continued taking the Flomax daily as prescribed and reports only mild stinging at initiation of his stream, gradually improving. He denies any abdominal pain or bowel symptoms. He has not noted any significant change in his energy level and overall, is quite pleased with his progress to date.  ALLERGIES:  is allergic to crestor [rosuvastatin] and simvastatin.  Meds: Current Outpatient Medications  Medication Sig Dispense Refill   pravastatin (PRAVACHOL) 40 MG tablet Take by mouth.     albuterol (VENTOLIN HFA) 108 (90 Base) MCG/ACT inhaler Inhale 2 puffs into the lungs every 6 (six) hours as needed. (Patient not taking: Reported on 12/08/2022)     aspirin EC 81 MG tablet Take 81 mg by mouth daily.     atorvastatin (LIPITOR) 80 MG tablet Take 80 mg by mouth every evening.     cholecalciferol (VITAMIN D3) 25 MCG (1000 UNIT) tablet Take 1,000 Units by mouth daily.      levothyroxine (SYNTHROID) 25 MCG tablet Take 25 mcg by mouth daily.     meclizine (ANTIVERT) 25 MG tablet Take 25 mg by mouth 3 (three) times daily as needed for dizziness or nausea.     Menthol, Topical Analgesic, (BLUE-EMU MAXIMUM STRENGTH EX) Apply topically.     metoprolol succinate (TOPROL-XL) 25 MG 24 hr tablet Take 12.5 mg by mouth daily.     mirabegron ER (MYRBETRIQ) 50 MG TB24 tablet Take 1 tablet (50 mg total) by mouth daily. 90 tablet 3   tamsulosin (FLOMAX) 0.4 MG CAPS capsule Take 0.4 mg by mouth daily after supper.     No current facility-administered medications for this encounter.    Physical Findings: In general this is a well appearing Caucasian male in no acute distress. He's alert and oriented x4 and appropriate throughout the examination. Cardiopulmonary assessment is negative for acute distress and he exhibits normal effort.   Lab Findings: Lab Results  Component Value Date   WBC 12.4 (H) 04/30/2022   HGB 15.6 12/19/2022   HCT 46.0 12/19/2022   MCV 87.1 04/30/2022   PLT 223 04/30/2022    Radiographic Findings:  Patient underwent CT imaging in our clinic for post implant dosimetry. The CT will be reviewed by Dr. Kathrynn Running to confirm there is an adequate distribution of radioactive seeds throughout the prostate gland and ensure that there are no seeds in or near the rectum.  We suspect the final radiation plan and dosimetry will show appropriate coverage of  the prostate gland. He understands that we will call and inform him of any unexpected findings on further review of his imaging and dosimetry.  Impression/Plan:  77 y.o. gentleman with Stage T1c adenocarcinoma of the prostate with Gleason score of 4+3, and PSA of 7.3.  The patient is recovering from the effects of radiation. His urinary symptoms should gradually improve over the next 4-6 months. We talked about this today. He is encouraged by his improvement already and is otherwise pleased with his outcome. We also  talked about long-term follow-up for prostate cancer following seed implant. He understands that ongoing PSA determinations and digital rectal exams will help perform surveillance to rule out disease recurrence. He does not currently have any follow up appointments scheduled with Dr. Dahlstedt/[facility] so we will call over to the [place] office to see if he will be scheduled with Dr. Retta Diones or one of his partners and ensure he stays on track for routine follow up and monitoring of the PSA. He understands what to expect with his PSA measures. Patient was also educated today about some of the long-term effects from radiation including a small risk for rectal bleeding and possibly erectile dysfunction. We talked about some of the general management approaches to these potential complications. However, I did encourage the patient to contact our office or return at any point if he has questions or concerns related to his previous radiation and prostate cancer.    Corey Arbour, PA-C

## 2023-01-02 NOTE — Progress Notes (Signed)
  Radiation Oncology         [phone] ________________________________  Name: Corey Glover MRN: [id number]  Date: 01/02/2023  DOB: 1946-02-06  COMPLEX SIMULATION NOTE  NARRATIVE:  The patient was brought to the CT Simulation planning suite today following prostate seed implantation approximately one month ago.  Identity was confirmed.  All relevant records and images related to the planned course of therapy were reviewed.  Then, the patient was set-up supine.  CT images were obtained.  The CT images were loaded into the planning software.  Then the prostate and rectum were contoured.  Treatment planning then occurred.  The implanted iodine 125 seeds were identified by the physics staff for projection of radiation distribution  I have requested : 3D Simulation  I have requested a DVH of the following structures: Prostate and rectum.    ________________________________  Artist Pais Kathrynn Running, M.D.

## 2023-01-07 ENCOUNTER — Ambulatory Visit: Payer: Medicare Other | Admitting: Radiation Oncology

## 2023-01-07 ENCOUNTER — Ambulatory Visit: Payer: Self-pay | Admitting: Urology

## 2023-01-12 NOTE — Progress Notes (Signed)
Patient is scheduled for urology follow up with Dr. Retta Diones on 9/24 at 2:45pm.  Patient's daughter, Efraim Kaufmann, aware of appointment date and time.

## 2023-01-16 ENCOUNTER — Telehealth: Payer: Self-pay

## 2023-01-16 NOTE — Telephone Encounter (Signed)
Patient's daughter called advising her dad has had frequent urination and dysuria after surgery. They wanted to know if that was normal. She requested a call at your earliest convenience.    Thank you

## 2023-01-16 NOTE — Telephone Encounter (Signed)
Return call to patient's daughter. Corey Glover states that her dad called her this morning at 4 AM about frequent urination and dysuria. Corey Glover states she called Dr. Kathrynn Running nurse and she inform her have patient take over the counter AZO for the burning and sting. Corey Glover is made aware to do a urine drop off to rule out infection. Corey Glover is also made aware Per Dr. Ronne Binning that frequent urination and dysuria could possible be cause by radiation if the patient has started radiation. Corey Glover denies that patient has started radiation. Corey Glover states that she call patient back this morning and patient states he is feeling better. Corey Glover is advised that a urine drop with help to rule out infection. Corey Glover states she is going to get the AZO and make patient aware about the urine drop off. Corey Glover states that she will called back if patient wants to do the urine drop off to rule out infection. Corey Glover voiced understanding.

## 2023-01-19 ENCOUNTER — Encounter: Payer: Self-pay | Admitting: Radiation Oncology

## 2023-01-19 DIAGNOSIS — Z191 Hormone sensitive malignancy status: Secondary | ICD-10-CM | POA: Diagnosis not present

## 2023-01-19 DIAGNOSIS — C61 Malignant neoplasm of prostate: Secondary | ICD-10-CM | POA: Diagnosis not present

## 2023-01-20 ENCOUNTER — Telehealth: Payer: Self-pay

## 2023-01-20 NOTE — Telephone Encounter (Signed)
Open in error

## 2023-01-20 NOTE — Telephone Encounter (Signed)
Patient's daughter left voice message that her dad states that he saw something that looks like a worm in his stool and believes the seed came out. Return call to [facility]  making her aware a message will be sent to the MD on recommendation

## 2023-01-20 NOTE — Progress Notes (Signed)
  Radiation Oncology         [phone] ________________________________  Name: KHADE KURSZEWSKI MRN: [id number]  Date: 01/19/2023  DOB: 1946-05-15  3D Planning Note   Prostate Brachytherapy Post-Implant Dosimetry  Diagnosis: 77 y.o. gentleman with Stage T1c adenocarcinoma of the prostate with Gleason score of 4+3, and PSA of 7.3.   Narrative: On a previous date, FAZAL SANGSTER returned following prostate seed implantation for post implant planning. He underwent CT scan complex simulation to delineate the three-dimensional structures of the pelvis and demonstrate the radiation distribution.  Since that time, the seed localization, and complex isodose planning with dose volume histograms have now been completed.  Results:   Prostate Coverage - The dose of radiation delivered to the 90% or more of the prostate gland (D90) was 124.31% of the prescription dose. This exceeds our goal of greater than 90%. Rectal Sparing - The volume of rectal tissue receiving the prescription dose or higher was 0.0 cc. This falls under our thresholds tolerance of 1.0 cc.  Impression: The prostate seed implant appears to show adequate target coverage and appropriate rectal sparing.  Plan:  The patient will continue to follow with urology for ongoing PSA determinations. I would anticipate a high likelihood for local tumor control with minimal risk for rectal morbidity.  ________________________________  Artist Pais Kathrynn Running, M.D.

## 2023-01-21 ENCOUNTER — Other Ambulatory Visit (HOSPITAL_COMMUNITY): Payer: Self-pay | Admitting: Family Medicine

## 2023-01-21 DIAGNOSIS — R300 Dysuria: Secondary | ICD-10-CM | POA: Diagnosis not present

## 2023-01-21 DIAGNOSIS — R42 Dizziness and giddiness: Secondary | ICD-10-CM | POA: Diagnosis not present

## 2023-01-21 DIAGNOSIS — R21 Rash and other nonspecific skin eruption: Secondary | ICD-10-CM | POA: Diagnosis not present

## 2023-01-21 DIAGNOSIS — L282 Other prurigo: Secondary | ICD-10-CM | POA: Diagnosis not present

## 2023-01-21 DIAGNOSIS — L298 Other pruritus: Secondary | ICD-10-CM | POA: Diagnosis not present

## 2023-01-23 NOTE — Radiation Completion Notes (Addendum)
Radiation Oncology         [phone] ________________________________  Name: Corey Glover MRN: [id number]  Date: 01/19/2023  DOB: 08/04/1945  Referring Physician: [name], M.D. Date of Service: 2023-01-23 Radiation Oncologist: Margaretmary Bayley, M.D. [name]     RADIATION ONCOLOGY END OF TREATMENT NOTE     Diagnosis: C61 Malignant neoplasm of prostate Staging on 2022-09-09: Malignant neoplasm of prostate (HCC) T=cT1c, N=cN0, M=cM0 Intent: Curative     ==========DELIVERED PLANS==========  Prostate Seed Implant Date: 2022-12-19   Plan Name: Prostate Seed Implant Site: Prostate Technique: Radioactive Seed Implant I-125 Mode: Brachytherapy Dose Per Fraction: 145 Gy Prescribed Dose (Delivered / Prescribed): 145 Gy / 145 Gy Prescribed Fxs (Delivered / Prescribed): 1 / 1     ==========ON TREATMENT VISIT DATES========== 2022-12-19   ------------------------------------------------   [name], MD [facility]  Radiation Oncology Direct Dial: [phone]  Fax: [phone] [email or web address]  Skype  LinkedIn

## 2023-01-23 NOTE — Telephone Encounter (Signed)
Patient's daughter is made aware of Dr. Retta Diones  "Please call--very unlikely seed was in stool--they are tiny (1/3 the size of a grain of rice). They do not look like worms. Might be worth contacting PCP" Melissa voiced understanding.

## 2023-01-27 ENCOUNTER — Ambulatory Visit (HOSPITAL_COMMUNITY): Payer: Medicare Other

## 2023-02-02 ENCOUNTER — Ambulatory Visit: Payer: Medicare Other | Admitting: Urology

## 2023-02-03 ENCOUNTER — Ambulatory Visit (HOSPITAL_COMMUNITY)
Admission: RE | Admit: 2023-02-03 | Discharge: 2023-02-03 | Disposition: A | Discharge status: Home / Self Care | Payer: Medicare Other | Source: Ambulatory Visit | Attending: Family Medicine | Admitting: Family Medicine

## 2023-02-03 DIAGNOSIS — R42 Dizziness and giddiness: Secondary | ICD-10-CM | POA: Diagnosis not present

## 2023-02-09 DIAGNOSIS — R21 Rash and other nonspecific skin eruption: Secondary | ICD-10-CM | POA: Diagnosis not present

## 2023-02-09 DIAGNOSIS — L282 Other prurigo: Secondary | ICD-10-CM | POA: Diagnosis not present

## 2023-02-09 DIAGNOSIS — L298 Other pruritus: Secondary | ICD-10-CM | POA: Diagnosis not present

## 2023-02-16 IMAGING — DX DG CHEST 1V PORT
1 series · 1 of 1 positions shown · non-contrast
Comparison: None.

CLINICAL DATA: Neck pain

EXAM:
PORTABLE CHEST 1 VIEW

[chest ap]
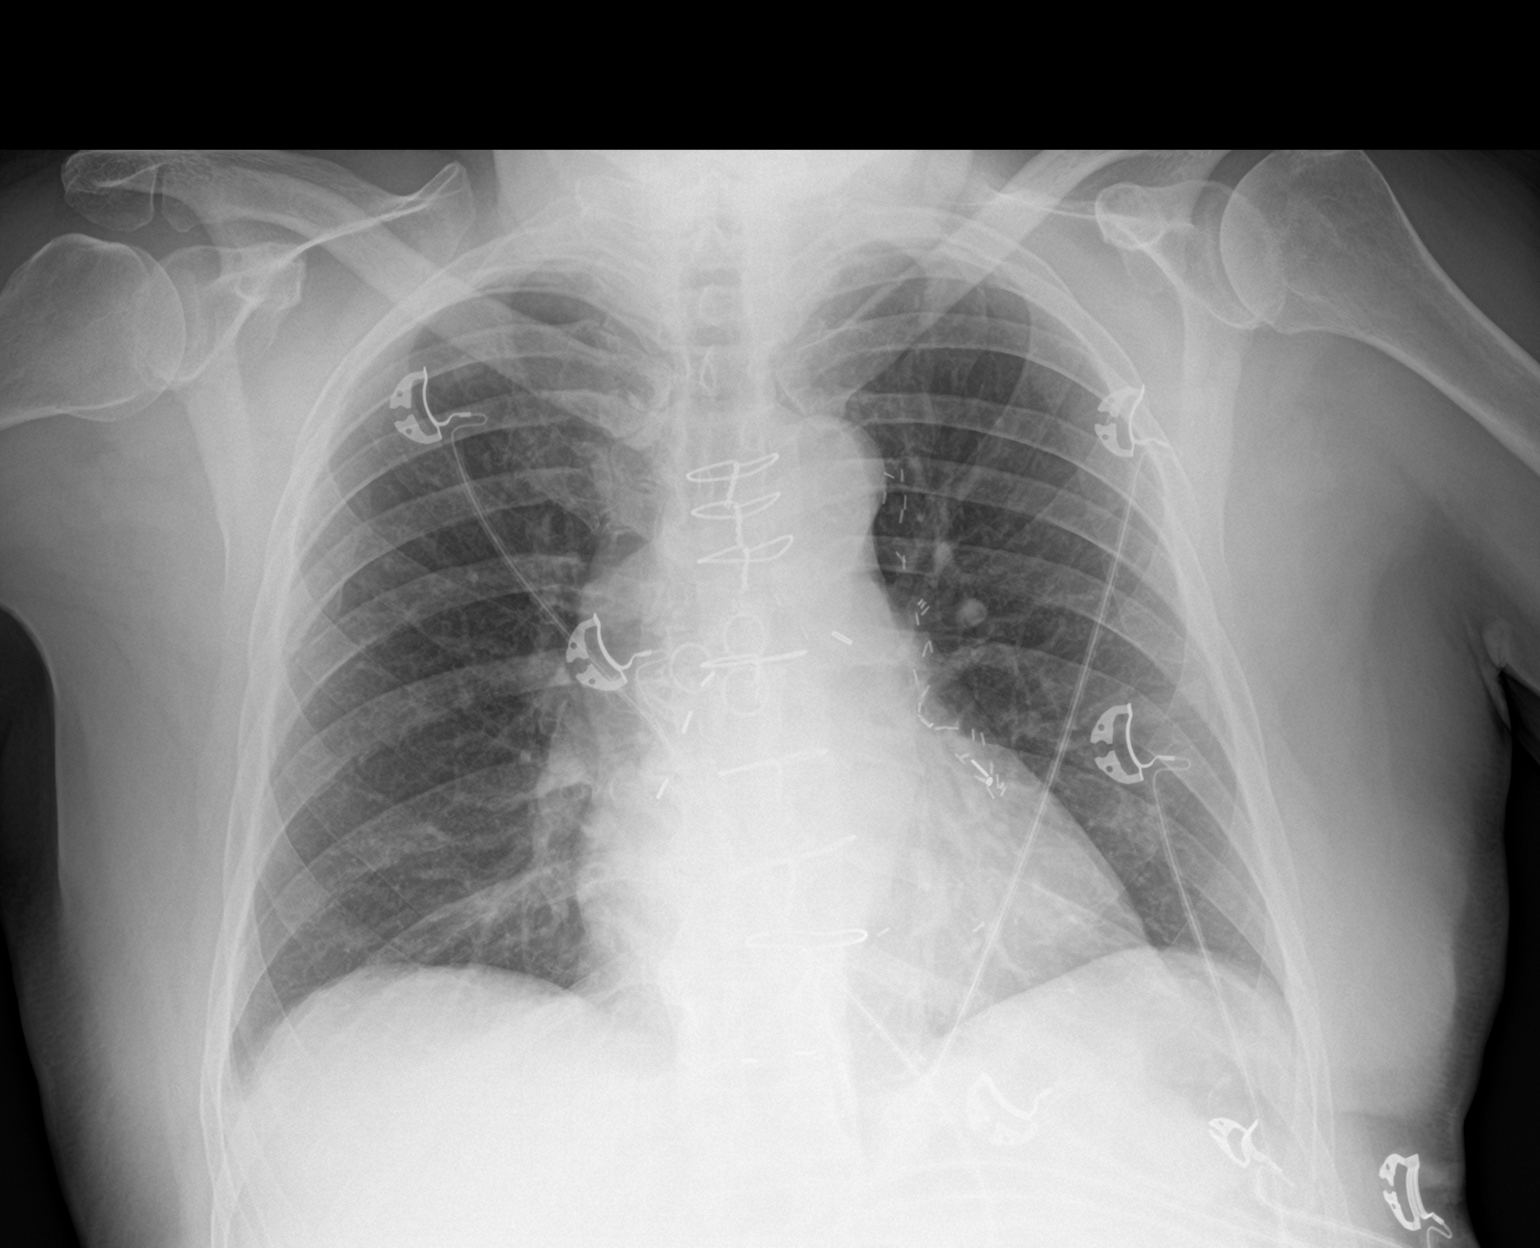

[1 of 1 positions shown; findings below may reference images not displayed]

FINDINGS: Lungs are clear.  No pleural effusion or pneumothorax.

The heart is normal in size. Postsurgical changes related to prior
CABG.

Median sternotomy.
IMPRESSION: No evidence of acute cardiopulmonary disease.

## 2023-02-16 IMAGING — DX DG CERVICAL SPINE 2 OR 3 VIEWS
4 series · 4 of 4 positions shown · non-contrast
Comparison: None.

CLINICAL DATA: Neck pain.

EXAM:
CERVICAL SPINE - 2-3 VIEW

[c-spine lat]
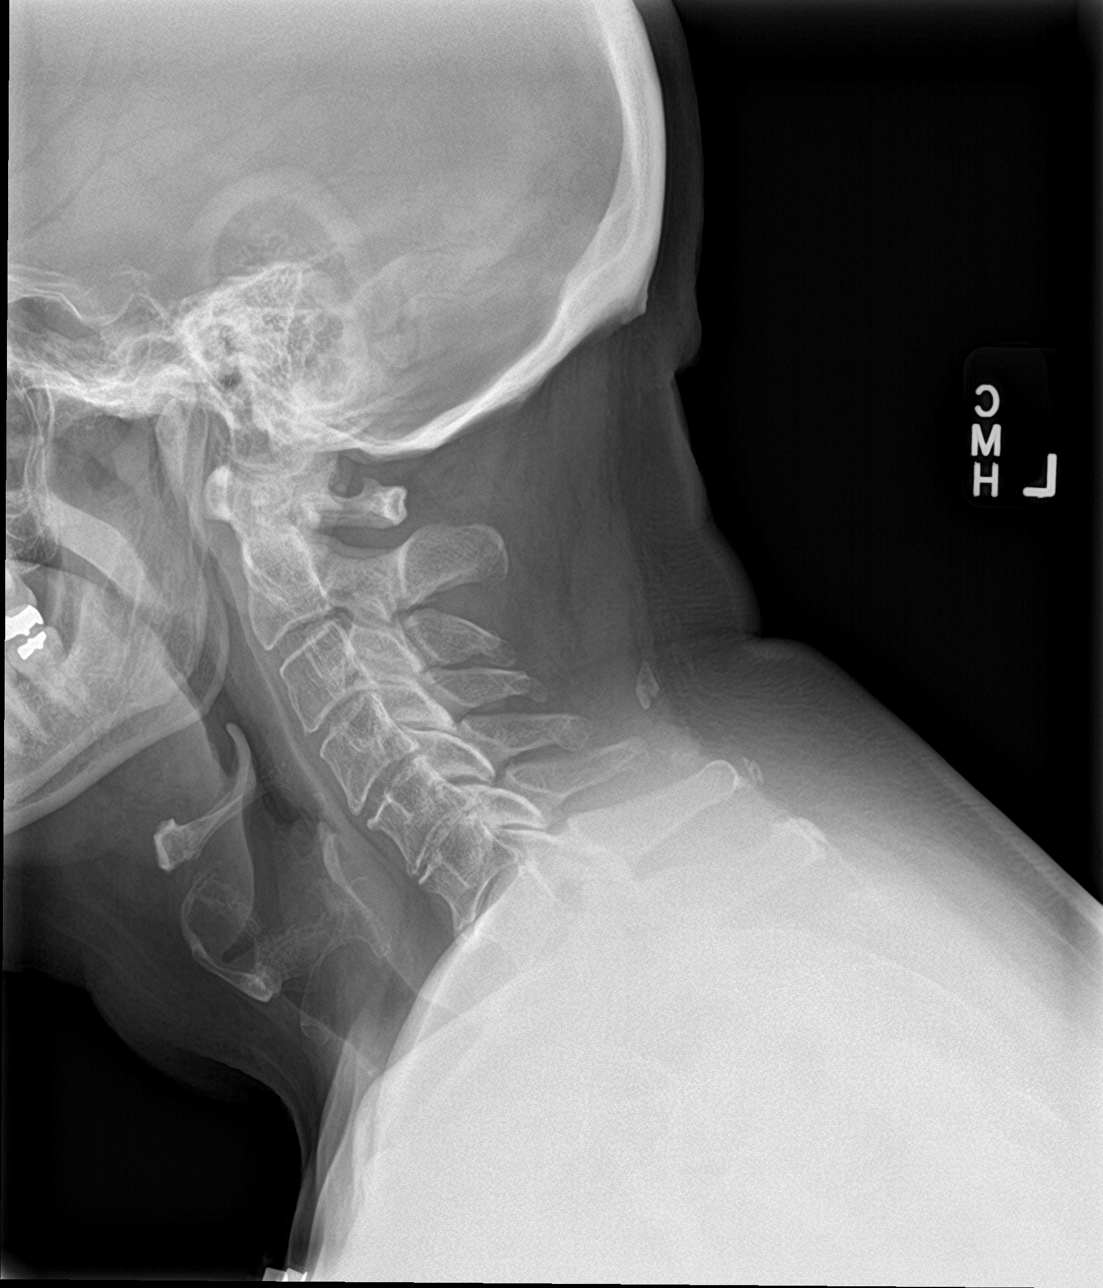

[c-spine ap]
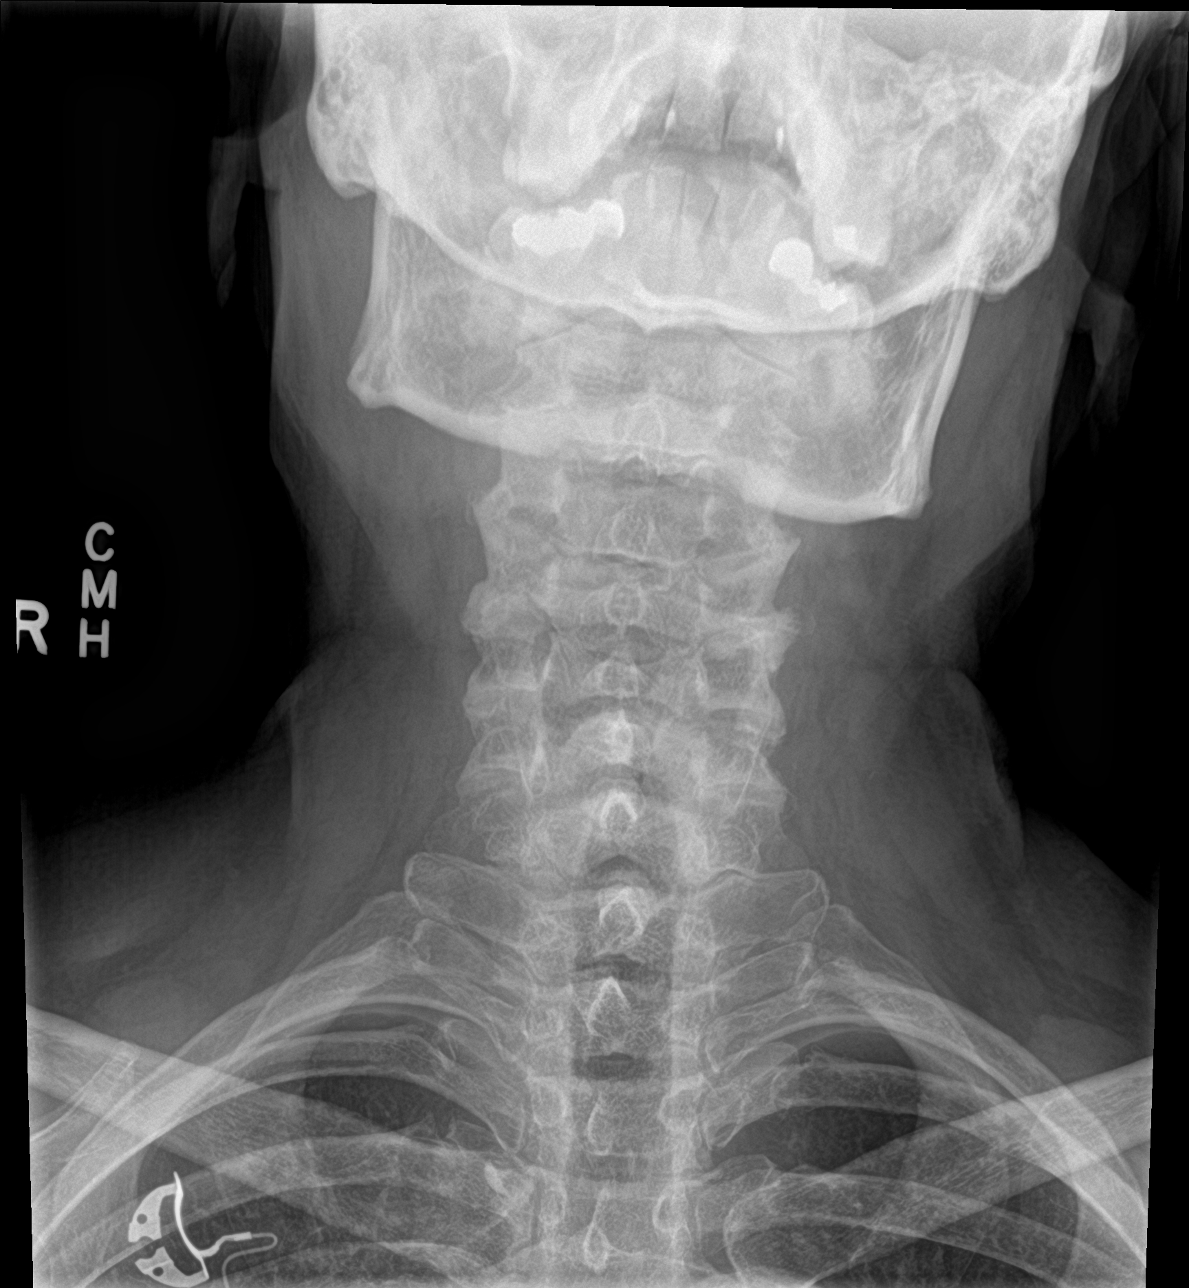

[c-spine swimmers trauma]
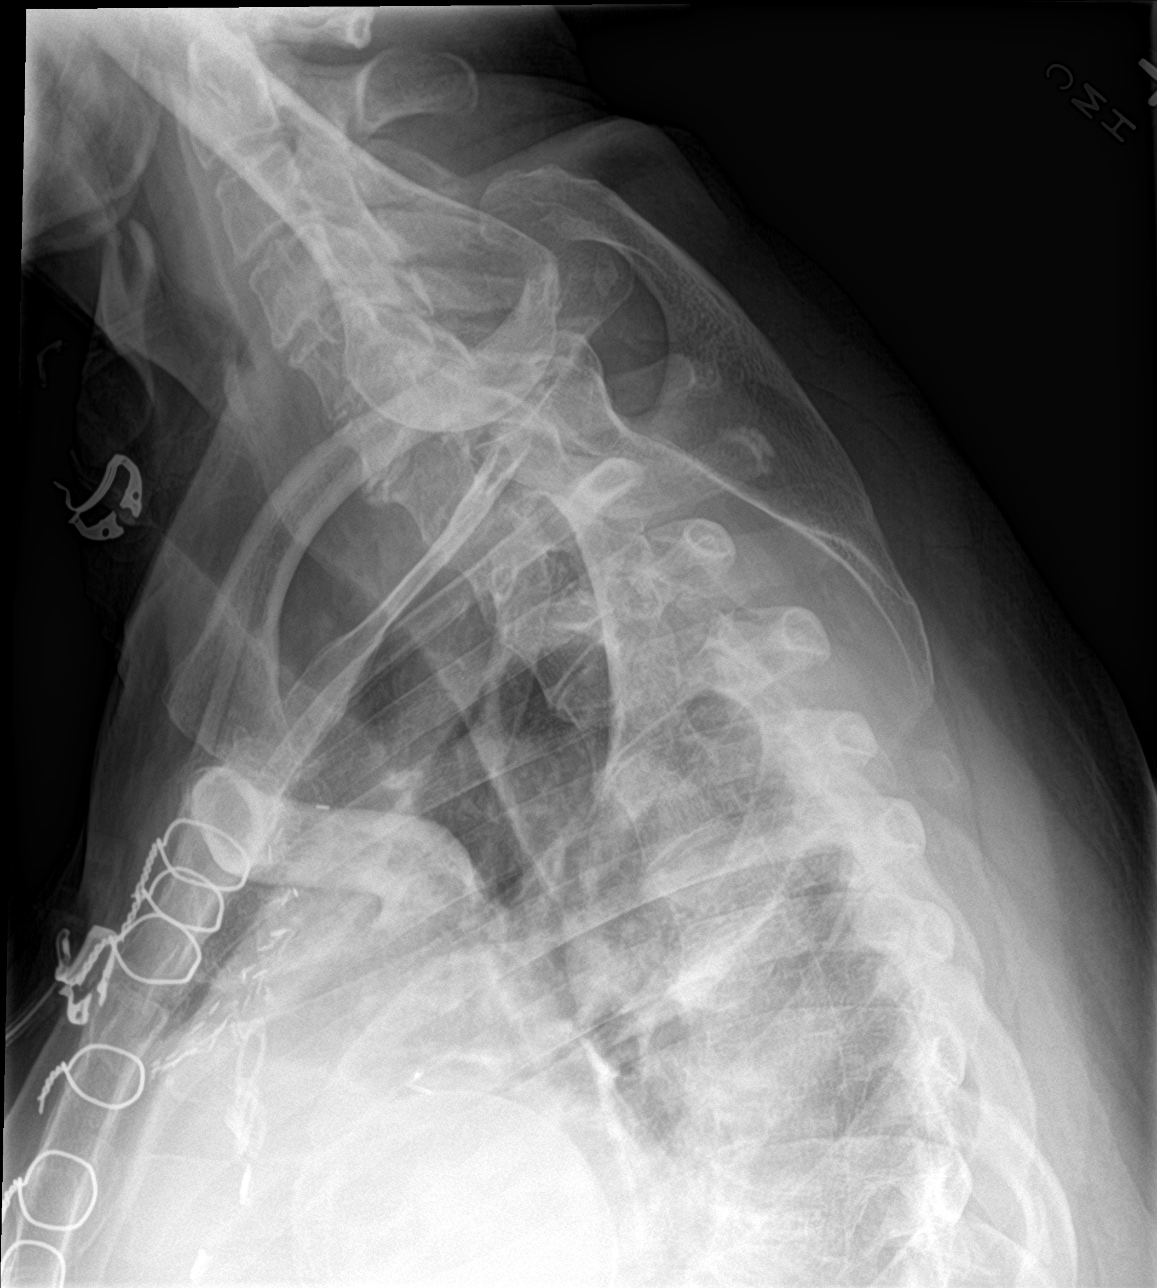

[c-spine open mouth]
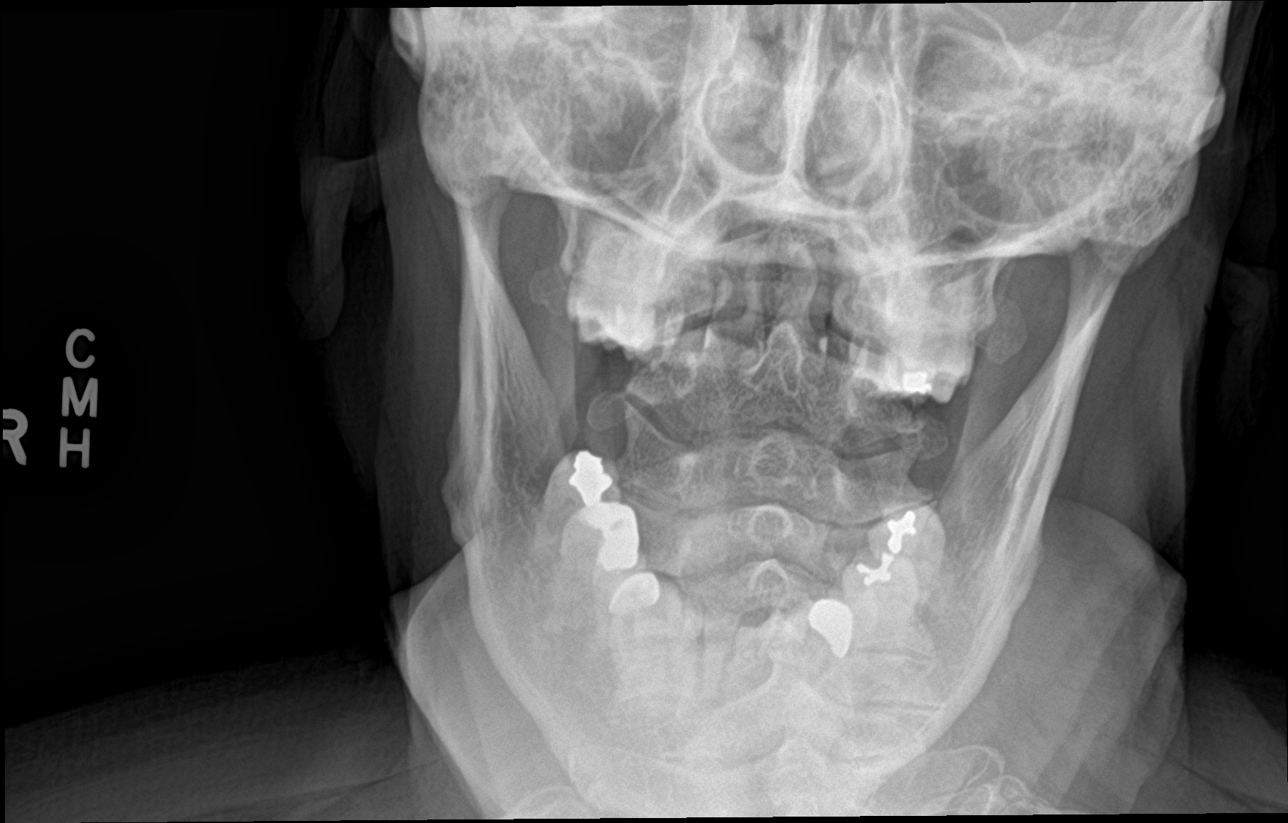

[4 of 4 positions shown; findings below may reference images not displayed]

FINDINGS: No acute fracture or subluxation of the cervical spine. There is
osteopenia with multilevel degenerative changes with disc space
narrowing and endplate irregularity and spurring. The visualized
posterior elements and odontoid appear intact. The soft tissues are
unremarkable.
IMPRESSION: No acute/traumatic cervical spine pathology.

## 2023-03-06 DIAGNOSIS — E782 Mixed hyperlipidemia: Secondary | ICD-10-CM | POA: Diagnosis not present

## 2023-03-06 DIAGNOSIS — E559 Vitamin D deficiency, unspecified: Secondary | ICD-10-CM | POA: Diagnosis not present

## 2023-03-06 DIAGNOSIS — R7303 Prediabetes: Secondary | ICD-10-CM | POA: Diagnosis not present

## 2023-03-06 DIAGNOSIS — E039 Hypothyroidism, unspecified: Secondary | ICD-10-CM | POA: Diagnosis not present

## 2023-03-06 DIAGNOSIS — R42 Dizziness and giddiness: Secondary | ICD-10-CM | POA: Diagnosis not present

## 2023-03-12 DIAGNOSIS — R42 Dizziness and giddiness: Secondary | ICD-10-CM | POA: Diagnosis not present

## 2023-03-12 DIAGNOSIS — I10 Essential (primary) hypertension: Secondary | ICD-10-CM | POA: Diagnosis not present

## 2023-03-12 DIAGNOSIS — I25810 Atherosclerosis of coronary artery bypass graft(s) without angina pectoris: Secondary | ICD-10-CM | POA: Diagnosis not present

## 2023-03-12 DIAGNOSIS — R0609 Other forms of dyspnea: Secondary | ICD-10-CM | POA: Diagnosis not present

## 2023-03-12 DIAGNOSIS — E559 Vitamin D deficiency, unspecified: Secondary | ICD-10-CM | POA: Diagnosis not present

## 2023-03-12 DIAGNOSIS — E782 Mixed hyperlipidemia: Secondary | ICD-10-CM | POA: Diagnosis not present

## 2023-03-12 DIAGNOSIS — E039 Hypothyroidism, unspecified: Secondary | ICD-10-CM | POA: Diagnosis not present

## 2023-03-12 DIAGNOSIS — R300 Dysuria: Secondary | ICD-10-CM | POA: Diagnosis not present

## 2023-03-12 DIAGNOSIS — M170 Bilateral primary osteoarthritis of knee: Secondary | ICD-10-CM | POA: Diagnosis not present

## 2023-03-12 DIAGNOSIS — R7303 Prediabetes: Secondary | ICD-10-CM | POA: Diagnosis not present

## 2023-03-12 NOTE — Progress Notes (Signed)
History of Present Illness: This 77 year old male comes in today for follow-up of adenocarcinoma the prostate, treated.  3.19.2024: TRUS/Bx. PSA 7.3, prostate volume 30 mL, PSAD 0.24. 1/12 cores (right apex medial) revealed GS 4+3 pattern in 50% of core.  6.28.2024: Underwent I-125 brachytherapy and SpaceOAR  9.24.2024: Here for first check following his seed placement.  He experienced no regular difficulties after his treatment.  Still on tamsulosin and Myrbetriq.  IPSS 14/3.  He had 1 episode of fecal incontinence, no gross hematuria, no dysuria, no blood per stool.   Past Medical History:  Diagnosis Date   Atherosclerosis    BPH (benign prostatic hyperplasia)    CAD in native artery    Dizziness    occ, no meds taken   Hyperlipidemia    Hypertension    Hypothyroidism    Mild cognitive impairment with memory loss    pt signs own concents per poa daughter Lossie Faes   Osteoarthritis of both knees    Overactive bladder    Prostate cancer (HCC)    Uses walker    Wears glasses     Past Surgical History:  Procedure Laterality Date   COLONOSCOPY WITH PROPOFOL N/A 08/16/2020   Procedure: COLONOSCOPY WITH PROPOFOL;  Surgeon: Lanelle Bal, DO;  [facility]: [facility];  Service: Endoscopy;  Laterality: N/A;  10:15am, per office - pt not able to come earlier due to transportation   CORONARY ARTERY BYPASS GRAFT     1997 6 vessel (Dr. Laneta Simmers)   leg surgery as a child     PROSTATE BIOPSY     RADIOACTIVE SEED IMPLANT N/A 12/19/2022   Procedure: RADIOACTIVE SEED IMPLANT/BRACHYTHERAPY IMPLANT;  Surgeon: [name], MD;  Location: [facility];  Service: Urology;  Laterality: N/A;   SPACE OAR INSTILLATION N/A 12/19/2022   Procedure: SPACE OAR INSTILLATION;  Surgeon: [name], MD;  Location: [facility];  Service: Urology;  Laterality: N/A;    Home Medications:  Allergies as of 03/17/2023       Reactions   Crestor [rosuvastatin]     Couldn't walk   Simvastatin         Medication List        Accurate as of March 12, 2023 10:36 AM. If you have any questions, ask your nurse or doctor.          albuterol 108 (90 Base) MCG/ACT inhaler Commonly known as: VENTOLIN HFA Inhale 2 puffs into the lungs every 6 (six) hours as needed.   aspirin EC 81 MG tablet Take 81 mg by mouth daily.   atorvastatin 80 MG tablet Commonly known as: LIPITOR Take 80 mg by mouth every evening.   BLUE-EMU MAXIMUM STRENGTH EX Apply topically.   cholecalciferol 25 MCG (1000 UNIT) tablet Commonly known as: VITAMIN D3 Take 1,000 Units by mouth daily.   levothyroxine 25 MCG tablet Commonly known as: SYNTHROID Take 25 mcg by mouth daily.   meclizine 25 MG tablet Commonly known as: ANTIVERT Take 25 mg by mouth 3 (three) times daily as needed for dizziness or nausea.   metoprolol succinate 25 MG 24 hr tablet Commonly known as: TOPROL-XL Take 12.5 mg by mouth daily.   mirabegron ER 50 MG Tb24 tablet Commonly known as: MYRBETRIQ Take 1 tablet (50 mg total) by mouth daily.   pravastatin 40 MG tablet Commonly known as: PRAVACHOL Take by mouth.   tamsulosin 0.4 MG Caps capsule Commonly known as: FLOMAX Take 0.4 mg by mouth daily after  supper.        Allergies:  Allergies  Allergen Reactions   Crestor [Rosuvastatin]     Couldn't walk   Simvastatin     Family History  Problem Relation Age of Onset   Heart disease Father    Heart disease Brother    Colon cancer Neg Hx     Social History:  reports that he has never smoked. He has never used smokeless tobacco. He reports that he does not drink alcohol and does not use drugs.  ROS: A complete review of systems was performed.  All systems are negative except for pertinent findings as noted.  Physical Exam:  Vital signs in last 24 hours: There were no vitals taken for this visit. Constitutional:  Alert and oriented, No acute distress Cardiovascular: Regular  rate  Respiratory: Normal respiratory effort Neurologic: Grossly intact, no focal deficits Psychiatric: Normal mood and affect  I have reviewed prior pt notes  I have reviewed urinalysis results  I have independently reviewed prior imaging- prostate ultrasound  Postprocedural note from Dr. Kathrynn Running reviewed  I have reviewed prior PSA and pathology results  I have reviewed IPSS sheet   Impression/Assessment:  Grade group 3 prostate cancer, status post I-125 brachytherapy 3 months ago.  BPH with stable LUTS, on tamsulosin and Myrbetriq  Plan:  PSA is checked today  I will have him come back in 4 months with PSA to be drawn before that visit

## 2023-03-17 ENCOUNTER — Ambulatory Visit: Payer: Medicare Other | Admitting: Urology

## 2023-03-17 ENCOUNTER — Encounter: Payer: Self-pay | Admitting: Urology

## 2023-03-17 DIAGNOSIS — N401 Enlarged prostate with lower urinary tract symptoms: Secondary | ICD-10-CM

## 2023-03-17 DIAGNOSIS — C61 Malignant neoplasm of prostate: Secondary | ICD-10-CM

## 2023-03-17 DIAGNOSIS — Z8546 Personal history of malignant neoplasm of prostate: Secondary | ICD-10-CM

## 2023-03-17 DIAGNOSIS — Z08 Encounter for follow-up examination after completed treatment for malignant neoplasm: Secondary | ICD-10-CM

## 2023-03-18 LAB — URINALYSIS, ROUTINE W REFLEX MICROSCOPIC
Bilirubin, UA: NEGATIVE
Glucose, UA: NEGATIVE
Ketones, UA: NEGATIVE
Leukocytes,UA: NEGATIVE
Nitrite, UA: NEGATIVE
Protein,UA: NEGATIVE
RBC, UA: NEGATIVE
Specific Gravity, UA: 1.03 (ref 1.005–1.030)
Urobilinogen, Ur: 0.2 mg/dL (ref 0.2–1.0)
pH, UA: 6 (ref 5.0–7.5)

## 2023-03-18 LAB — PSA: Prostate Specific Ag, Serum: 1.6 ng/mL (ref 0.0–4.0)

## 2023-05-28 DIAGNOSIS — M1711 Unilateral primary osteoarthritis, right knee: Secondary | ICD-10-CM | POA: Diagnosis not present

## 2023-05-28 DIAGNOSIS — M1712 Unilateral primary osteoarthritis, left knee: Secondary | ICD-10-CM | POA: Diagnosis not present

## 2023-05-28 DIAGNOSIS — M170 Bilateral primary osteoarthritis of knee: Secondary | ICD-10-CM | POA: Diagnosis not present

## 2023-07-07 ENCOUNTER — Other Ambulatory Visit: Payer: Medicare Other

## 2023-07-07 DIAGNOSIS — C61 Malignant neoplasm of prostate: Secondary | ICD-10-CM

## 2023-07-08 LAB — PSA: Prostate Specific Ag, Serum: 0.6 ng/mL (ref 0.0–4.0)

## 2023-07-13 ENCOUNTER — Other Ambulatory Visit: Payer: Self-pay

## 2023-07-13 ENCOUNTER — Emergency Department: Payer: Medicare Other

## 2023-07-13 DIAGNOSIS — I2510 Atherosclerotic heart disease of native coronary artery without angina pectoris: Secondary | ICD-10-CM | POA: Diagnosis not present

## 2023-07-13 DIAGNOSIS — R059 Cough, unspecified: Secondary | ICD-10-CM | POA: Diagnosis not present

## 2023-07-13 DIAGNOSIS — Z8546 Personal history of malignant neoplasm of prostate: Secondary | ICD-10-CM | POA: Insufficient documentation

## 2023-07-13 DIAGNOSIS — E039 Hypothyroidism, unspecified: Secondary | ICD-10-CM | POA: Insufficient documentation

## 2023-07-13 DIAGNOSIS — J069 Acute upper respiratory infection, unspecified: Secondary | ICD-10-CM | POA: Insufficient documentation

## 2023-07-13 DIAGNOSIS — Z79899 Other long term (current) drug therapy: Secondary | ICD-10-CM | POA: Diagnosis not present

## 2023-07-13 DIAGNOSIS — I10 Essential (primary) hypertension: Secondary | ICD-10-CM | POA: Insufficient documentation

## 2023-07-13 DIAGNOSIS — Z20822 Contact with and (suspected) exposure to covid-19: Secondary | ICD-10-CM | POA: Insufficient documentation

## 2023-07-13 DIAGNOSIS — B9789 Other viral agents as the cause of diseases classified elsewhere: Secondary | ICD-10-CM | POA: Insufficient documentation

## 2023-07-13 DIAGNOSIS — R531 Weakness: Secondary | ICD-10-CM | POA: Diagnosis not present

## 2023-07-13 DIAGNOSIS — Z743 Need for continuous supervision: Secondary | ICD-10-CM | POA: Diagnosis not present

## 2023-07-13 DIAGNOSIS — R410 Disorientation, unspecified: Secondary | ICD-10-CM | POA: Diagnosis not present

## 2023-07-13 DIAGNOSIS — Z7982 Long term (current) use of aspirin: Secondary | ICD-10-CM | POA: Diagnosis not present

## 2023-07-13 DIAGNOSIS — Z951 Presence of aortocoronary bypass graft: Secondary | ICD-10-CM | POA: Insufficient documentation

## 2023-07-13 DIAGNOSIS — R6889 Other general symptoms and signs: Secondary | ICD-10-CM | POA: Diagnosis not present

## 2023-07-13 LAB — CBC
HCT: 44.6 % (ref 39.0–52.0)
Hemoglobin: 15.1 g/dL (ref 13.0–17.0)
MCH: 29.9 pg (ref 26.0–34.0)
MCHC: 33.9 g/dL (ref 30.0–36.0)
MCV: 88.3 fL (ref 80.0–100.0)
Platelets: 200 10*3/uL (ref 150–400)
RBC: 5.05 MIL/uL (ref 4.22–5.81)
RDW: 13.4 % (ref 11.5–15.5)
WBC: 9.9 10*3/uL (ref 4.0–10.5)
nRBC: 0 % (ref 0.0–0.2)

## 2023-07-13 LAB — RESP PANEL BY RT-PCR (RSV, FLU A&B, COVID)  RVPGX2
Influenza A by PCR: NEGATIVE
Influenza B by PCR: NEGATIVE
Resp Syncytial Virus by PCR: NEGATIVE
SARS Coronavirus 2 by RT PCR: NEGATIVE

## 2023-07-13 LAB — TROPONIN I (HIGH SENSITIVITY): Troponin I (High Sensitivity): 6 ng/L (ref <18)

## 2023-07-13 NOTE — ED Notes (Signed)
Pt placed on 2 L. 

## 2023-07-13 NOTE — ED Triage Notes (Signed)
EMS brings pt in from home for weakness and confusion as reported by family

## 2023-07-13 NOTE — Progress Notes (Incomplete)
History of Present Illness: This 78 year old male comes in today for follow-up of adenocarcinoma the prostate, treated.  3.19.2024: TRUS/Bx. PSA 7.3, prostate volume 30 mL, PSAD 0.24. 1/12 cores (right apex medial) revealed GS 4+3 pattern in 50% of core.  6.28.2024: Underwent I-125 brachytherapy and SpaceOAR  1.21.2025: PSA 0.6   Past Medical History:  Diagnosis Date   Atherosclerosis    BPH (benign prostatic hyperplasia)    CAD in native artery    Dizziness    occ, no meds taken   Hyperlipidemia    Hypertension    Hypothyroidism    Mild cognitive impairment with memory loss    pt signs own concents per poa daughter Lossie Faes   Osteoarthritis of both knees    Overactive bladder    Prostate cancer (HCC)    Uses walker    Wears glasses     Past Surgical History:  Procedure Laterality Date   COLONOSCOPY WITH PROPOFOL N/A 08/16/2020   Procedure: COLONOSCOPY WITH PROPOFOL;  Surgeon: Lanelle Bal, DO;  [facility];  Service: Endoscopy;  Laterality: N/A;  10:15am, per office - pt not able to come earlier due to transportation   CORONARY ARTERY BYPASS GRAFT     1997 6 vessel (Dr. Laneta Simmers)   leg surgery as a child     PROSTATE BIOPSY     RADIOACTIVE SEED IMPLANT N/A 12/19/2022   Procedure: RADIOACTIVE SEED IMPLANT/BRACHYTHERAPY IMPLANT;  Surgeon: [name], MD;  Location: [facility];  Service: Urology;  Laterality: N/A;   SPACE OAR INSTILLATION N/A 12/19/2022   Procedure: SPACE OAR INSTILLATION;  Surgeon: [name], MD;  Location: [facility];  Service: Urology;  Laterality: N/A;    Home Medications:  Allergies as of 07/14/2023       Reactions   Crestor [rosuvastatin]    Couldn't walk   Simvastatin         Medication List        Accurate as of July 13, 2023  1:46 PM. If you have any questions, ask your nurse or doctor.          albuterol 108 (90 Base) MCG/ACT inhaler Commonly known as:  VENTOLIN HFA Inhale 2 puffs into the lungs every 6 (six) hours as needed.   aspirin EC 81 MG tablet Take 81 mg by mouth daily.   atorvastatin 80 MG tablet Commonly known as: LIPITOR Take 80 mg by mouth every evening.   BLUE-EMU MAXIMUM STRENGTH EX Apply topically.   cholecalciferol 25 MCG (1000 UNIT) tablet Commonly known as: VITAMIN D3 Take 1,000 Units by mouth daily.   levothyroxine 25 MCG tablet Commonly known as: SYNTHROID Take 25 mcg by mouth daily.   meclizine 25 MG tablet Commonly known as: ANTIVERT Take 25 mg by mouth 3 (three) times daily as needed for dizziness or nausea.   metoprolol succinate 25 MG 24 hr tablet Commonly known as: TOPROL-XL Take 12.5 mg by mouth daily.   mirabegron ER 50 MG Tb24 tablet Commonly known as: MYRBETRIQ Take 1 tablet (50 mg total) by mouth daily.   pravastatin 40 MG tablet Commonly known as: PRAVACHOL Take by mouth.   tamsulosin 0.4 MG Caps capsule Commonly known as: FLOMAX Take 0.4 mg by mouth daily after supper.        Allergies:  Allergies  Allergen Reactions   Crestor [Rosuvastatin]     Couldn't walk   Simvastatin     Family History  Problem Relation Age of Onset  Heart disease Father    Heart disease Brother    Colon cancer Neg Hx     Social History:  reports that he has never smoked. He has never used smokeless tobacco. He reports that he does not drink alcohol and does not use drugs.  ROS: A complete review of systems was performed.  All systems are negative except for pertinent findings as noted.  Physical Exam:  Vital signs in last 24 hours: There were no vitals taken for this visit. Constitutional:  Alert and oriented, No acute distress Cardiovascular: Regular rate  Respiratory: Normal respiratory effort Neurologic: Grossly intact, no focal deficits Psychiatric: Normal mood and affect  I have reviewed prior pt notes  I have reviewed urinalysis results  I have independently reviewed prior  imaging- prostate ultrasound  I have reviewed prior PSA and pathology results  I have reviewed IPSS sheet   Impression/Assessment:  Grade group 3 prostate cancer, status post I-125 brachytherapy 3 months ago.  BPH with stable LUTS, on tamsulosin and Myrbetriq  Plan:

## 2023-07-13 NOTE — ED Triage Notes (Signed)
Pt to ED via EMS from home, per ems pt reports weakness and URI for the past 2 days. Pt reports cough and congestion. Pt denies pain.

## 2023-07-14 ENCOUNTER — Emergency Department
Admission: EM | Admit: 2023-07-14 | Discharge: 2023-07-14 | Disposition: A | Discharge status: Home / Self Care | Payer: Medicare Other | Attending: Emergency Medicine | Admitting: Emergency Medicine

## 2023-07-14 ENCOUNTER — Ambulatory Visit: Payer: Medicare Other | Admitting: Urology

## 2023-07-14 ENCOUNTER — Emergency Department: Payer: Medicare Other

## 2023-07-14 DIAGNOSIS — J069 Acute upper respiratory infection, unspecified: Secondary | ICD-10-CM

## 2023-07-14 DIAGNOSIS — R350 Frequency of micturition: Secondary | ICD-10-CM

## 2023-07-14 DIAGNOSIS — R531 Weakness: Secondary | ICD-10-CM

## 2023-07-14 DIAGNOSIS — C61 Malignant neoplasm of prostate: Secondary | ICD-10-CM

## 2023-07-14 DIAGNOSIS — N138 Other obstructive and reflux uropathy: Secondary | ICD-10-CM

## 2023-07-14 LAB — URINALYSIS, ROUTINE W REFLEX MICROSCOPIC
Bilirubin Urine: NEGATIVE
Glucose, UA: NEGATIVE mg/dL
Hgb urine dipstick: NEGATIVE
Ketones, ur: 5 mg/dL — AB
Leukocytes,Ua: NEGATIVE
Nitrite: NEGATIVE
Protein, ur: NEGATIVE mg/dL
Specific Gravity, Urine: 1.019 (ref 1.005–1.030)
pH: 5 (ref 5.0–8.0)

## 2023-07-14 LAB — BASIC METABOLIC PANEL
Anion gap: 8 (ref 5–15)
BUN: 15 mg/dL (ref 8–23)
CO2: 23 mmol/L (ref 22–32)
Calcium: 8.4 mg/dL — ABNORMAL LOW (ref 8.9–10.3)
Chloride: 102 mmol/L (ref 98–111)
Creatinine, Ser: 1.01 mg/dL (ref 0.61–1.24)
GFR, Estimated: >60 mL/min (ref >60)
Glucose, Bld: 91 mg/dL (ref 70–99)
Potassium: 3.8 mmol/L (ref 3.5–5.1)
Sodium: 133 mmol/L — ABNORMAL LOW (ref 135–145)

## 2023-07-14 LAB — TROPONIN I (HIGH SENSITIVITY): Troponin I (High Sensitivity): 6 ng/L (ref <18)

## 2023-07-14 NOTE — Discharge Instructions (Addendum)
Please use your walker at all times.  I recommend close follow-up with your primary care doctor.  I have placed an order for home physical therapy.

## 2023-07-14 NOTE — ED Provider Notes (Signed)
[facility] Provider Note    Event Date/Time   First MD Initiated Contact with Patient 07/14/23 [phone]     (approximate)   History   Weakness   HPI  Corey Glover is a 78 y.o. male with history of CAD, mild cognitive impairment, prostate cancer who presents to the emergency department with generalized weakness today.  Patient lives with his son.  Son called the daughter because patient was unable to stand up out of the recliner on his own.  He normally ambulates with a cane.  No recent falls.  Denies any headache, chest pain or shortness of breath, abdominal pain.  No recent fevers, vomiting or diarrhea but has had cough and congestion.   History provided by patient, daughter.    Past Medical History:  Diagnosis Date   Atherosclerosis    BPH (benign prostatic hyperplasia)    CAD in native artery    Dizziness    occ, no meds taken   Hyperlipidemia    Hypertension    Hypothyroidism    Mild cognitive impairment with memory loss    pt signs own concents per poa daughter Lossie Faes   Osteoarthritis of both knees    Overactive bladder    Prostate cancer (HCC)    Uses walker    Wears glasses     Past Surgical History:  Procedure Laterality Date   COLONOSCOPY WITH PROPOFOL N/A 08/16/2020   Procedure: COLONOSCOPY WITH PROPOFOL;  Surgeon: Lanelle Bal, DO;  [facility];  Service: Endoscopy;  Laterality: N/A;  10:15am, per office - pt not able to come earlier due to transportation   CORONARY ARTERY BYPASS GRAFT     1997 6 vessel (Dr. Laneta Simmers)   leg surgery as a child     PROSTATE BIOPSY     RADIOACTIVE SEED IMPLANT N/A 12/19/2022   Procedure: RADIOACTIVE SEED IMPLANT/BRACHYTHERAPY IMPLANT;  Surgeon: [name], MD;  Location: [facility];  Service: Urology;  Laterality: N/A;   SPACE OAR INSTILLATION N/A 12/19/2022   Procedure: SPACE OAR INSTILLATION;  Surgeon: [name], MD;  Location: [facility];  Service: Urology;  Laterality: N/A;    MEDICATIONS:  Prior to Admission medications   Medication Sig Start Date End Date Taking? Authorizing Provider  albuterol (VENTOLIN HFA) 108 (90 Base) MCG/ACT inhaler Inhale 2 puffs into the lungs every 6 (six) hours as needed.    [provider]  aspirin EC 81 MG tablet Take 81 mg by mouth daily.    [provider]  atorvastatin (LIPITOR) 80 MG tablet Take 80 mg by mouth every evening. 09/15/22   [provider]  cholecalciferol (VITAMIN D3) 25 MCG (1000 UNIT) tablet Take 1,000 Units by mouth daily.    [provider]  levothyroxine (SYNTHROID) 25 MCG tablet Take 25 mcg by mouth daily. 09/15/22   [provider]  meclizine (ANTIVERT) 25 MG tablet Take 25 mg by mouth 3 (three) times daily as needed for dizziness or nausea.    [provider]  Menthol, Topical Analgesic, (BLUE-EMU MAXIMUM STRENGTH EX) Apply topically.    [provider]  metoprolol succinate (TOPROL-XL) 25 MG 24 hr tablet Take 12.5 mg by mouth daily.    [provider]  mirabegron ER (MYRBETRIQ) 50 MG TB24 tablet Take 1 tablet (50 mg total) by mouth daily. 10/07/22   [name], MD  pravastatin (PRAVACHOL) 40 MG tablet Take by mouth. 04/02/21   [provider]  tamsulosin (FLOMAX) 0.4 MG CAPS capsule Take 0.4 mg by mouth daily after supper.    [provider]    Physical Exam   Triage Vital Signs: ED Triage Vitals  Encounter Vitals Group     BP 07/13/23 2148 118/66     Systolic BP Percentile --      Diastolic BP Percentile --      Pulse Rate 07/13/23 2148 70     Resp 07/13/23 2148 20     Temp 07/13/23 2148 99 F (37.2 C)     Temp src --      SpO2 07/13/23 2137 91 %     Weight 07/13/23 2142 165 lb (74.8 kg)     Height 07/13/23 2142 5\' 6"  (1.676 m)     Head Circumference --      Peak Flow --      Pain Score 07/13/23 2142 0     Pain Loc --      Pain Education --       Exclude from Growth Chart --     Most recent vital signs: Vitals:   07/13/23 2148 07/14/23 0325  BP: 118/66 127/68  Pulse: 70 66  Resp: 20 20  Temp: 99 F (37.2 C) 98 F (36.7 C)  SpO2: 92% 95%    CONSTITUTIONAL: Alert, oriented to person and place but not time.  Elderly. HEAD: Normocephalic, atraumatic EYES: Conjunctivae clear, pupils appear equal, sclera nonicteric ENT: normal nose; moist mucous membranes NECK: Supple, normal ROM CARD: RRR; S1 and S2 appreciated RESP: Normal chest excursion without splinting or tachypnea; breath sounds clear and equal bilaterally; no wheezes, no rhonchi, no rales, no hypoxia or respiratory distress, speaking full sentences ABD/GI: Non-distended; soft, non-tender, no rebound, no guarding, no peritoneal signs BACK: The back appears normal EXT: Normal ROM in all joints; no deformity noted, no edema SKIN: Normal color for age and race; warm; no rash on exposed skin NEURO: Moves all extremities equally, normal speech, reports normal sensation diffusely, has a hard time following some commands, no obvious facial asymmetry PSYCH: The patient's mood and manner are appropriate.   ED Results / Procedures / Treatments   LABS: (all labs ordered are listed, but only abnormal results are displayed) Labs Reviewed  URINALYSIS, ROUTINE W REFLEX MICROSCOPIC - Abnormal; Notable for the following components:      Result Value   Color, Urine YELLOW (*)    APPearance CLEAR (*)    Ketones, ur 5 (*)    All other components within normal limits  BASIC METABOLIC PANEL - Abnormal; Notable for the following components:   Sodium 133 (*)    Calcium 8.4 (*)    All other components within normal limits  RESP PANEL BY RT-PCR (RSV, FLU A&B, COVID)  RVPGX2  CBC  TROPONIN I (HIGH SENSITIVITY)  TROPONIN I (HIGH SENSITIVITY)     EKG:  EKG Interpretation Date/Time:  Monday July 13 2023 21:45:19 EST Ventricular Rate:  71 PR Interval:    QRS  Duration:  82 QT Interval:  386 QTC Calculation: 419 R Axis:   29  Text Interpretation: Normal sinus rhythm Nonspecific ST and T wave abnormality Abnormal ECG Artifact Confirmed by Rochele Raring [id number]) on 07/14/2023 2:42:06 AM         RADIOLOGY: My personal review and interpretation of imaging: CT head unremarkable.  Chest x-ray clear.  I have personally reviewed all radiology reports.   CT HEAD WO CONTRAST ( ) Result Date: 07/14/2023 CLINICAL DATA:  Mental status  change, unknown cause EXAM: CT HEAD WITHOUT CONTRAST TECHNIQUE: Contiguous axial images were obtained from the base of the skull through the vertex without intravenous contrast. RADIATION DOSE REDUCTION: This exam was performed according to the departmental dose-optimization program which includes automated exposure control, adjustment of the mA and/or kV according to patient size and/or use of iterative reconstruction technique. COMPARISON:  None Available. FINDINGS: Brain: Mild atrophy. No acute intracranial abnormality. Specifically, no hemorrhage, hydrocephalus, mass lesion, acute infarction, or significant intracranial injury. Vascular: No hyperdense vessel or unexpected calcification. Skull: No acute calvarial abnormality. Sinuses/Orbits: No acute findings. Mucosal thickening throughout the paranasal sinuses. Other: None IMPRESSION: No acute intracranial abnormality. Electronically Signed   By: Charlett Nose M.D.   On: 07/14/2023 01:23   DG Chest Port 1 View Result Date: 07/13/2023 CLINICAL DATA:  Cough, weakness, confusion EXAM: PORTABLE CHEST 1 VIEW COMPARISON:  08/07/2021 FINDINGS: Single frontal view of the chest demonstrates stable postsurgical changes from CABG. Cardiac silhouette is unremarkable. No acute airspace disease, effusion, or pneumothorax. No acute bony abnormalities. IMPRESSION: 1. No acute intrathoracic process. Electronically Signed   By: Sharlet Salina M.D.   On: 07/13/2023 22:07      PROCEDURES:  Critical Care performed: No     Procedures    IMPRESSION / MDM / ASSESSMENT AND PLAN / ED COURSE  I reviewed the triage vital signs and the nursing notes.    Patient here with generalized weakness.    DIFFERENTIAL DIAGNOSIS (includes but not limited to):   Dehydration, UTI, CVA, anemia, electrolyte derangement, viral URI, pneumonia   Patient's presentation is most consistent with acute presentation with potential threat to life or bodily function.   PLAN: Workup initiated from triage.  Normal hemoglobin, electrolytes.  Troponin x 2 negative.  COVID, flu and RSV negative.  CT head and chest x-ray reviewed and interpreted by myself and the radiologist and showed no acute abnormality.  Will obtain urine sample.  Oral temperature was 99.  Will recheck.   MEDICATIONS GIVEN IN ED: Medications - No data to display   ED COURSE: Patient's urine shows no sign of infection or significant dehydration.  He has been able to get up and ambulate here with a walker.  Discussed with family I suspect he may have a viral URI that exacerbated his weakness today.  Repeat temperature is normal.  No indication that he needs antibiotics.  I feel he is safe to be discharged home with a walker and follow-up with his PCP.  Patient and family comfortable with this plan.  I have placed an order for home PT.   At this time, I do not feel there is any life-threatening condition present. I reviewed all nursing notes, vitals, pertinent previous records.  All lab and urine results, EKGs, imaging ordered have been independently reviewed and interpreted by myself.  I reviewed all available radiology reports from any imaging ordered this visit.  Based on my assessment, I feel the patient is safe to be discharged home without further emergent workup and can continue workup as an outpatient as needed. Discussed all findings, treatment plan as well as usual and customary return precautions.  They  verbalize understanding and are comfortable with this plan.  Outpatient follow-up has been provided as needed.  All questions have been answered.    CONSULTS:  none   OUTSIDE RECORDS REVIEWED: Reviewed last urology note in September 2024.       FINAL CLINICAL IMPRESSION(S) / ED DIAGNOSES   Final diagnoses:  Generalized  weakness  Viral URI     Rx / DC Orders   ED Discharge Orders          Ordered    Home Health        07/14/23 [phone]    Face-to-face encounter (required for Medicare/Medicaid patients)       Comments: I Quinnton Bury certify that this patient is under my care and that I, or a nurse practitioner or physician's assistant working with me, had a face-to-face encounter that meets the physician face-to-face encounter requirements with this patient on 07/14/2023. The encounter with the patient was in whole, or in part for the following medical condition(s) which is the primary reason for home health care (List medical condition): weakness   07/14/23 0350             Note:  This document was prepared using Dragon voice recognition software and may include unintentional dictation errors.   Candiace West, Layla Maw, DO 07/14/23 [phone]

## 2023-07-14 NOTE — ED Notes (Signed)
Patient stated he had a walker at home, did not give him one in er

## 2023-07-14 NOTE — ED Notes (Signed)
Ambulate,  Patient ambulated with walker,  patient difficulty standing and moving at first, after approximately 20 ft patient began to walk faster,  family member at bedside stated; patient was moving slower, [place] also complained of right knee pain, history of knee pain

## 2023-07-21 ENCOUNTER — Telehealth: Payer: Self-pay

## 2023-07-21 NOTE — Progress Notes (Signed)
Transition Care Management Unsuccessful Follow-up Telephone Call  Date of discharge and from where:  07/14/2023  Attempts:  1st Attempt  Reason for unsuccessful TCM follow-up call:  No answer/busy  Serenidy Waltz [facility]  [facility] Guide Direct Dial: [phone]  Fax: [phone] Website: [email or web address]

## 2023-07-21 NOTE — Progress Notes (Signed)
Transition Care Management Follow-up Telephone Call Date of discharge and from where: 07/14/2023 [facility] How have you been since you were released from the hospital? Patient's daughter on DPR stated patient is feeling better. Any questions or concerns? No  Items Reviewed: Did the pt receive and understand the discharge instructions provided? Yes  Medications obtained and verified?  No medication prescribed. Other? No  Any new allergies since your discharge? No  Dietary orders reviewed? Yes Do you have support at home? Yes   Follow up appointments reviewed:  PCP Hospital f/u appt confirmed? No  Scheduled to see  on  @ . Specialist Hospital f/u appt confirmed? No  Scheduled to see  on  @ . Are transportation arrangements needed? No  If their condition worsens, is the pt aware to call PCP or go to the Emergency Dept.? Yes Was the patient provided with contact information for the PCP's office or ED? Yes Was to pt encouraged to call back with questions or concerns? Yes   Hershell Brandl [facility]  [facility] Guide Direct Dial: [phone]  Fax: [phone] Website: [email or web address]

## 2023-08-25 DIAGNOSIS — M1711 Unilateral primary osteoarthritis, right knee: Secondary | ICD-10-CM | POA: Diagnosis not present

## 2023-08-27 DIAGNOSIS — M1712 Unilateral primary osteoarthritis, left knee: Secondary | ICD-10-CM | POA: Diagnosis not present

## 2023-09-01 DIAGNOSIS — M1711 Unilateral primary osteoarthritis, right knee: Secondary | ICD-10-CM | POA: Diagnosis not present

## 2023-09-03 DIAGNOSIS — M1712 Unilateral primary osteoarthritis, left knee: Secondary | ICD-10-CM | POA: Diagnosis not present

## 2023-09-03 DIAGNOSIS — E559 Vitamin D deficiency, unspecified: Secondary | ICD-10-CM | POA: Diagnosis not present

## 2023-09-03 DIAGNOSIS — R7303 Prediabetes: Secondary | ICD-10-CM | POA: Diagnosis not present

## 2023-09-03 DIAGNOSIS — R42 Dizziness and giddiness: Secondary | ICD-10-CM | POA: Diagnosis not present

## 2023-09-03 DIAGNOSIS — E782 Mixed hyperlipidemia: Secondary | ICD-10-CM | POA: Diagnosis not present

## 2023-09-03 DIAGNOSIS — E039 Hypothyroidism, unspecified: Secondary | ICD-10-CM | POA: Diagnosis not present

## 2023-09-08 DIAGNOSIS — M1711 Unilateral primary osteoarthritis, right knee: Secondary | ICD-10-CM | POA: Diagnosis not present

## 2023-09-10 DIAGNOSIS — M1712 Unilateral primary osteoarthritis, left knee: Secondary | ICD-10-CM | POA: Diagnosis not present

## 2023-09-15 DIAGNOSIS — R0609 Other forms of dyspnea: Secondary | ICD-10-CM | POA: Diagnosis not present

## 2023-09-15 DIAGNOSIS — R7303 Prediabetes: Secondary | ICD-10-CM | POA: Diagnosis not present

## 2023-09-15 DIAGNOSIS — I10 Essential (primary) hypertension: Secondary | ICD-10-CM | POA: Diagnosis not present

## 2023-09-15 DIAGNOSIS — E039 Hypothyroidism, unspecified: Secondary | ICD-10-CM | POA: Diagnosis not present

## 2023-09-15 DIAGNOSIS — M179 Osteoarthritis of knee, unspecified: Secondary | ICD-10-CM | POA: Diagnosis not present

## 2023-09-15 DIAGNOSIS — R42 Dizziness and giddiness: Secondary | ICD-10-CM | POA: Diagnosis not present

## 2023-09-15 DIAGNOSIS — I25810 Atherosclerosis of coronary artery bypass graft(s) without angina pectoris: Secondary | ICD-10-CM | POA: Diagnosis not present

## 2023-09-15 DIAGNOSIS — E782 Mixed hyperlipidemia: Secondary | ICD-10-CM | POA: Diagnosis not present

## 2023-09-15 DIAGNOSIS — E559 Vitamin D deficiency, unspecified: Secondary | ICD-10-CM | POA: Diagnosis not present

## 2023-09-15 DIAGNOSIS — R300 Dysuria: Secondary | ICD-10-CM | POA: Diagnosis not present

## 2023-09-29 ENCOUNTER — Other Ambulatory Visit: Payer: Self-pay | Admitting: Urology

## 2023-09-29 DIAGNOSIS — R350 Frequency of micturition: Secondary | ICD-10-CM

## 2023-10-05 NOTE — Progress Notes (Signed)
 History of Present Illness: This 78 year old male comes in today for follow-up of adenocarcinoma the prostate, treated.  3.19.2024: TRUS/Bx. PSA 7.3, prostate volume 30 mL, PSAD 0.24. 1/12 cores (right apex medial) revealed GS 4+3 pattern in 50% of core.  6.28.2024: Underwent I-125 brachytherapy and SpaceOAR  4.15.2025: PSA 3 mos ago 0.6. He denies significant lower urinary tract symptoms.  He has noted no blood in his urine or stool.  IPSS 7/2.   Past Medical History:  Diagnosis Date   Atherosclerosis    BPH (benign prostatic hyperplasia)    CAD in native artery    Dizziness    occ, no meds taken   Hyperlipidemia    Hypertension    Hypothyroidism    Mild cognitive impairment with memory loss    pt signs own concents per poa daughter Gillis Ladd   Osteoarthritis of both knees    Overactive bladder    Prostate cancer (HCC)    Uses walker    Wears glasses     Past Surgical History:  Procedure Laterality Date   COLONOSCOPY WITH PROPOFOL N/A 08/16/2020   Procedure: COLONOSCOPY WITH PROPOFOL;  Surgeon: Vinetta Greening, DO;  [facility];  Service: Endoscopy;  Laterality: N/A;  10:15am, per office - pt not able to come earlier due to transportation   CORONARY ARTERY BYPASS GRAFT     1997 6 vessel (Dr. Sherene Dilling)   leg surgery as a child     PROSTATE BIOPSY     RADIOACTIVE SEED IMPLANT N/A 12/19/2022   Procedure: RADIOACTIVE SEED IMPLANT/BRACHYTHERAPY IMPLANT;  Surgeon: Trent Frizzle, MD;  Location: [facility];  Service: Urology;  Laterality: N/A;   SPACE OAR INSTILLATION N/A 12/19/2022   Procedure: SPACE OAR INSTILLATION;  Surgeon: Trent Frizzle, MD;  Location: [facility];  Service: Urology;  Laterality: N/A;    Home Medications:  Allergies as of 10/06/2023       Reactions   Crestor [rosuvastatin]    Couldn't walk   Simvastatin         Medication List        Accurate as of October 05, 2023 10:27 AM. If you have  any questions, ask your nurse or doctor.          albuterol 108 (90 Base) MCG/ACT inhaler Commonly known as: VENTOLIN HFA Inhale 2 puffs into the lungs every 6 (six) hours as needed.   aspirin EC 81 MG tablet Take 81 mg by mouth daily.   atorvastatin 80 MG tablet Commonly known as: LIPITOR Take 80 mg by mouth every evening.   BLUE-EMU MAXIMUM STRENGTH EX Apply topically.   cholecalciferol 25 MCG (1000 UNIT) tablet Commonly known as: VITAMIN D3 Take 1,000 Units by mouth daily.   levothyroxine 25 MCG tablet Commonly known as: SYNTHROID Take 25 mcg by mouth daily.   meclizine 25 MG tablet Commonly known as: ANTIVERT Take 25 mg by mouth 3 (three) times daily as needed for dizziness or nausea.   metoprolol succinate 25 MG 24 hr tablet Commonly known as: TOPROL-XL Take 12.5 mg by mouth daily.   Myrbetriq 50 MG Tb24 tablet Generic drug: mirabegron ER TAKE ONE TABLET BY MOUTH ONCE DAILY.   pravastatin 40 MG tablet Commonly known as: PRAVACHOL Take by mouth.   tamsulosin 0.4 MG Caps capsule Commonly known as: FLOMAX Take 0.4 mg by mouth daily after supper.        Allergies:  Allergies  Allergen Reactions   Crestor [Rosuvastatin]  Couldn't walk   Simvastatin     Family History  Problem Relation Age of Onset   Heart disease Father    Heart disease Brother    Colon cancer Neg Hx     Social History:  reports that he has never smoked. He has never used smokeless tobacco. He reports that he does not drink alcohol and does not use drugs.  ROS: A complete review of systems was performed.  All systems are negative except for pertinent findings as noted.  Physical Exam:  Vital signs in last 24 hours: There were no vitals taken for this visit. Constitutional:  Alert and oriented, No acute distress Cardiovascular: Regular rate  Respiratory: Normal respiratory effort Neurologic: Grossly intact, no focal deficits Psychiatric: Normal mood and affect  I have  reviewed prior pt notes  I have reviewed urinalysis results--clear today.  I have independently reviewed prior imaging- prostate ultrasound/volume  I have reviewed prior PSA as well as pathology results  I have reviewed IPSS sheet   Impression/Assessment:  Grade group 3 prostate cancer, status post I-125 brachytherapy  9months ago.  Excellent PSA response thus far with no significant lower urinary tract symptom changes  BPH with stable LUTS  Plan:  PSA is checked today  I will have him come back in 6 months

## 2023-10-06 ENCOUNTER — Ambulatory Visit: Payer: Medicare Other | Admitting: Urology

## 2023-10-06 VITALS — BP 125/73 | HR 76

## 2023-10-06 DIAGNOSIS — N401 Enlarged prostate with lower urinary tract symptoms: Secondary | ICD-10-CM | POA: Diagnosis not present

## 2023-10-06 DIAGNOSIS — R350 Frequency of micturition: Secondary | ICD-10-CM

## 2023-10-06 DIAGNOSIS — Z8546 Personal history of malignant neoplasm of prostate: Secondary | ICD-10-CM

## 2023-10-06 DIAGNOSIS — N138 Other obstructive and reflux uropathy: Secondary | ICD-10-CM

## 2023-10-06 LAB — POCT URINALYSIS DIPSTICK
Bilirubin, UA: NEGATIVE
Blood, UA: NEGATIVE
Glucose, UA: NEGATIVE
Ketones, UA: NEGATIVE
Leukocytes, UA: NEGATIVE
Nitrite, UA: NEGATIVE
Protein, UA: NEGATIVE
Spec Grav, UA: 1.025 (ref 1.010–1.025)
Urobilinogen, UA: 0.2 U/dL
pH, UA: 6 (ref 5.0–8.0)

## 2023-10-07 ENCOUNTER — Encounter: Payer: Self-pay | Admitting: Urology

## 2023-10-07 LAB — PSA: Prostate Specific Ag, Serum: 0.5 ng/mL (ref 0.0–4.0)

## 2023-12-15 ENCOUNTER — Other Ambulatory Visit (HOSPITAL_COMMUNITY): Payer: Self-pay | Admitting: Family Medicine

## 2023-12-15 DIAGNOSIS — R29898 Other symptoms and signs involving the musculoskeletal system: Secondary | ICD-10-CM | POA: Diagnosis not present

## 2023-12-15 DIAGNOSIS — R413 Other amnesia: Secondary | ICD-10-CM | POA: Diagnosis not present

## 2023-12-15 DIAGNOSIS — R0609 Other forms of dyspnea: Secondary | ICD-10-CM

## 2023-12-15 DIAGNOSIS — R42 Dizziness and giddiness: Secondary | ICD-10-CM | POA: Diagnosis not present

## 2023-12-15 DIAGNOSIS — E039 Hypothyroidism, unspecified: Secondary | ICD-10-CM | POA: Diagnosis not present

## 2023-12-15 DIAGNOSIS — R6889 Other general symptoms and signs: Secondary | ICD-10-CM | POA: Diagnosis not present

## 2023-12-18 DIAGNOSIS — E559 Vitamin D deficiency, unspecified: Secondary | ICD-10-CM | POA: Diagnosis not present

## 2023-12-18 DIAGNOSIS — I10 Essential (primary) hypertension: Secondary | ICD-10-CM | POA: Diagnosis not present

## 2023-12-18 DIAGNOSIS — Z951 Presence of aortocoronary bypass graft: Secondary | ICD-10-CM | POA: Diagnosis not present

## 2023-12-18 DIAGNOSIS — M170 Bilateral primary osteoarthritis of knee: Secondary | ICD-10-CM | POA: Diagnosis not present

## 2023-12-18 DIAGNOSIS — Z7982 Long term (current) use of aspirin: Secondary | ICD-10-CM | POA: Diagnosis not present

## 2023-12-18 DIAGNOSIS — E039 Hypothyroidism, unspecified: Secondary | ICD-10-CM | POA: Diagnosis not present

## 2023-12-18 DIAGNOSIS — R7303 Prediabetes: Secondary | ICD-10-CM | POA: Diagnosis not present

## 2023-12-18 DIAGNOSIS — Z556 Problems related to health literacy: Secondary | ICD-10-CM | POA: Diagnosis not present

## 2023-12-18 DIAGNOSIS — I2510 Atherosclerotic heart disease of native coronary artery without angina pectoris: Secondary | ICD-10-CM | POA: Diagnosis not present

## 2023-12-18 DIAGNOSIS — Z9181 History of falling: Secondary | ICD-10-CM | POA: Diagnosis not present

## 2023-12-18 DIAGNOSIS — E782 Mixed hyperlipidemia: Secondary | ICD-10-CM | POA: Diagnosis not present

## 2023-12-23 DIAGNOSIS — Z7982 Long term (current) use of aspirin: Secondary | ICD-10-CM | POA: Diagnosis not present

## 2023-12-23 DIAGNOSIS — Z556 Problems related to health literacy: Secondary | ICD-10-CM | POA: Diagnosis not present

## 2023-12-23 DIAGNOSIS — E039 Hypothyroidism, unspecified: Secondary | ICD-10-CM | POA: Diagnosis not present

## 2023-12-23 DIAGNOSIS — E559 Vitamin D deficiency, unspecified: Secondary | ICD-10-CM | POA: Diagnosis not present

## 2023-12-23 DIAGNOSIS — Z9181 History of falling: Secondary | ICD-10-CM | POA: Diagnosis not present

## 2023-12-23 DIAGNOSIS — R7303 Prediabetes: Secondary | ICD-10-CM | POA: Diagnosis not present

## 2023-12-23 DIAGNOSIS — M170 Bilateral primary osteoarthritis of knee: Secondary | ICD-10-CM | POA: Diagnosis not present

## 2023-12-23 DIAGNOSIS — E782 Mixed hyperlipidemia: Secondary | ICD-10-CM | POA: Diagnosis not present

## 2023-12-23 DIAGNOSIS — Z951 Presence of aortocoronary bypass graft: Secondary | ICD-10-CM | POA: Diagnosis not present

## 2023-12-23 DIAGNOSIS — I2510 Atherosclerotic heart disease of native coronary artery without angina pectoris: Secondary | ICD-10-CM | POA: Diagnosis not present

## 2023-12-23 DIAGNOSIS — I10 Essential (primary) hypertension: Secondary | ICD-10-CM | POA: Diagnosis not present

## 2024-01-05 ENCOUNTER — Ambulatory Visit (HOSPITAL_COMMUNITY)
Admission: RE | Admit: 2024-01-05 | Discharge: 2024-01-05 | Disposition: A | Discharge status: Home / Self Care | Source: Ambulatory Visit | Attending: Family Medicine | Admitting: Family Medicine

## 2024-01-05 DIAGNOSIS — E785 Hyperlipidemia, unspecified: Secondary | ICD-10-CM | POA: Insufficient documentation

## 2024-01-05 DIAGNOSIS — I10 Essential (primary) hypertension: Secondary | ICD-10-CM | POA: Diagnosis not present

## 2024-01-05 DIAGNOSIS — I2510 Atherosclerotic heart disease of native coronary artery without angina pectoris: Secondary | ICD-10-CM | POA: Insufficient documentation

## 2024-01-05 DIAGNOSIS — I088 Other rheumatic multiple valve diseases: Secondary | ICD-10-CM | POA: Insufficient documentation

## 2024-01-05 DIAGNOSIS — R7303 Prediabetes: Secondary | ICD-10-CM | POA: Insufficient documentation

## 2024-01-05 DIAGNOSIS — R0609 Other forms of dyspnea: Secondary | ICD-10-CM | POA: Insufficient documentation

## 2024-01-05 DIAGNOSIS — Z951 Presence of aortocoronary bypass graft: Secondary | ICD-10-CM | POA: Insufficient documentation

## 2024-01-05 LAB — ECHOCARDIOGRAM COMPLETE
Area-P 1/2: 3.6 cm2
S' Lateral: 2.7 cm

## 2024-01-05 NOTE — Progress Notes (Signed)
  Echocardiogram 2D Echocardiogram has been performed.  Koleen KANDICE Popper, RDCS 01/05/2024, 1:19 PM

## 2024-01-08 ENCOUNTER — Other Ambulatory Visit (HOSPITAL_COMMUNITY)

## 2024-01-12 DIAGNOSIS — M170 Bilateral primary osteoarthritis of knee: Secondary | ICD-10-CM | POA: Diagnosis not present

## 2024-01-12 DIAGNOSIS — Z556 Problems related to health literacy: Secondary | ICD-10-CM | POA: Diagnosis not present

## 2024-01-12 DIAGNOSIS — Z7982 Long term (current) use of aspirin: Secondary | ICD-10-CM | POA: Diagnosis not present

## 2024-01-12 DIAGNOSIS — Z9181 History of falling: Secondary | ICD-10-CM | POA: Diagnosis not present

## 2024-01-12 DIAGNOSIS — E782 Mixed hyperlipidemia: Secondary | ICD-10-CM | POA: Diagnosis not present

## 2024-01-12 DIAGNOSIS — I2510 Atherosclerotic heart disease of native coronary artery without angina pectoris: Secondary | ICD-10-CM | POA: Diagnosis not present

## 2024-01-12 DIAGNOSIS — R7303 Prediabetes: Secondary | ICD-10-CM | POA: Diagnosis not present

## 2024-01-12 DIAGNOSIS — I10 Essential (primary) hypertension: Secondary | ICD-10-CM | POA: Diagnosis not present

## 2024-01-12 DIAGNOSIS — E039 Hypothyroidism, unspecified: Secondary | ICD-10-CM | POA: Diagnosis not present

## 2024-01-12 DIAGNOSIS — E559 Vitamin D deficiency, unspecified: Secondary | ICD-10-CM | POA: Diagnosis not present

## 2024-01-12 DIAGNOSIS — Z951 Presence of aortocoronary bypass graft: Secondary | ICD-10-CM | POA: Diagnosis not present

## 2024-03-11 ENCOUNTER — Ambulatory Visit

## 2024-03-11 VITALS — BP 106/68 | HR 58 | Temp 98.1°F | Ht 66.0 in | Wt 162.2 lb

## 2024-03-11 DIAGNOSIS — R0609 Other forms of dyspnea: Secondary | ICD-10-CM

## 2024-03-11 DIAGNOSIS — I951 Orthostatic hypotension: Secondary | ICD-10-CM | POA: Diagnosis not present

## 2024-03-11 NOTE — Patient Instructions (Addendum)
 Notification of test results are managed in the following manner: If there are any recommendations or changes to the plan of care discussed in office today, we will contact you and let you know what they are. If you do not hear from us , then your results are normal/expected and you can view them through your MyChart account, or a letter will be sent to you. Thank you again for trusting us  with your care [facility].

## 2024-03-11 NOTE — Progress Notes (Signed)
 New Patient Pulmonology Office Visit   Subjective:  Patient ID: Corey Glover, male    DOB: [birth year]  MRN: [id number]  Referred by: Shona Norleen PEDLAR, MD  CC:  Chief Complaint  Patient presents with   Consult    Referred forr dyspnea Only with exertion, does not monitor oxygen at home. Pt is unsure if it's been getting worse.  Has not gotten flu shot this year.    HPI Corey Glover is a 78 y.o. male with CAD s/p CABG, HTN, allergic rhinitis, hypothyroidism, BPPV, prostate CA s/p brachytherapy, presents for evaluation of SOB on E.   Very little cough, no phlegm. Will get SOB on ambulation chronic for a year. Intermittent CP even at rest, non pleuritic. No n/v, f/c.  Lightheadedness mainly on standing. Has vertigo and BPPV and on meclizine. No ringing of ears. Dopplers carotid neg in 2024.   Lung Health: Functional status: limited ambulation due to knee pain. Sob is not the limiting factor. Few feet.  Smoking: non smoker.  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 No pets.   Tests: ECHO 7/25: EF 60-65%, RVEF normal. No valvular abn.  CXR Jan 2025: reviewed by me. Normal.    Allergies: Crestor [rosuvastatin] and Simvastatin  Current Outpatient Medications:    aspirin EC 81 MG tablet, Take 81 mg by mouth daily., Disp: , Rfl:    cholecalciferol (VITAMIN D3) 25 MCG (1000 UNIT) tablet, Take 1,000 Units by mouth daily., Disp: , Rfl:    levothyroxine (SYNTHROID) 25 MCG tablet, Take 25 mcg by mouth daily., Disp: , Rfl:    meclizine (ANTIVERT) 25 MG tablet, Take 25 mg by mouth 3 (three) times daily as needed for dizziness or nausea., Disp: , Rfl:    Menthol, Topical Analgesic, (BLUE-EMU MAXIMUM STRENGTH EX), Apply topically. (Patient taking differently: Apply topically.), Disp: , Rfl:    metoprolol succinate (TOPROL-XL) 25 MG 24 hr tablet, Take 12.5 mg by mouth daily., Disp: , Rfl:    MYRBETRIQ  50 MG TB24 tablet, TAKE ONE TABLET BY MOUTH  ONCE DAILY., Disp: 30 tablet, Rfl: 11   pravastatin (PRAVACHOL) 40 MG tablet, Take by mouth., Disp: , Rfl:    tamsulosin (FLOMAX) 0.4 MG CAPS capsule, Take 0.4 mg by mouth daily after supper., Disp: , Rfl:    albuterol (VENTOLIN HFA) 108 (90 Base) MCG/ACT inhaler, Inhale 2 puffs into the lungs every 6 (six) hours as needed. (Patient not taking: Reported on 03/11/2024), Disp: , Rfl:  Past Medical History:  Diagnosis Date   Atherosclerosis    BPH (benign prostatic hyperplasia)    CAD in native artery    Dizziness    occ, no meds taken   Hyperlipidemia    Hypertension    Hypothyroidism    Mild cognitive impairment with memory loss    pt signs own concents per poa daughter eleanor bunker   Osteoarthritis of both knees    Overactive bladder    Prostate cancer (HCC)    Uses walker    Wears glasses    Past Surgical History:  Procedure Laterality Date   COLONOSCOPY WITH PROPOFOL  N/A 08/16/2020   Procedure: COLONOSCOPY WITH PROPOFOL ;  Surgeon: Cindie Carlin POUR, DO;  [facility];  Service: Endoscopy;  Laterality: N/A;  10:15am, per office - pt not able to come earlier due to transportation   CORONARY ARTERY BYPASS GRAFT     1997 6 vessel (Dr. Lucas)   leg surgery as a child  PROSTATE BIOPSY     RADIOACTIVE SEED IMPLANT N/A 12/19/2022   Procedure: RADIOACTIVE SEED IMPLANT/BRACHYTHERAPY IMPLANT;  Surgeon: Matilda Senior, MD;  Location: [facility];  Service: Urology;  Laterality: N/A;   SPACE OAR INSTILLATION N/A 12/19/2022   Procedure: SPACE OAR INSTILLATION;  Surgeon: Matilda Senior, MD;  Location: [facility];  Service: Urology;  Laterality: N/A;   Family History  Problem Relation Age of Onset   Heart disease Father    Heart disease Brother    Colon cancer Neg Hx    Social History   Socioeconomic History   Marital status: Single    Spouse name: Not on file   Number of children: Not on file   Years of education: Not on file    Highest education level: Not on file  Occupational History   Not on file  Tobacco Use   Smoking status: Never   Smokeless tobacco: Never  Vaping Use   Vaping status: Never Used  Substance and Sexual Activity   Alcohol use: No   Drug use: No   Sexual activity: Not Currently  Other Topics Concern   Not on file  Social History Narrative   Divorced since 2000.Son lives with him.Retired ,used to work in Publishing rights manager.Ex-military.   Social Drivers of Corporate investment banker Strain: Low Risk  (07/21/2023)   Overall Financial Resource Strain (CARDIA)    Difficulty of Paying Living Expenses: Not very hard  Food Insecurity: No Food Insecurity (07/21/2023)   Hunger Vital Sign    Worried About Running Out of Food in the Last Year: Never true    Ran Out of Food in the Last Year: Never true  Transportation Needs: No Transportation Needs (07/21/2023)   PRAPARE - Administrator, Civil Service (Medical): No    Lack of Transportation (Non-Medical): No  Physical Activity: Not on file  Stress: Not on file  Social Connections: Not on file  Intimate Partner Violence: Not At Risk (10/21/2022)   Humiliation, Afraid, Rape, and Kick questionnaire    Fear of Current or Ex-Partner: No    Emotionally Abused: No    Physically Abused: No    Sexually Abused: No       Objective:  BP 106/68   Pulse (!) 58   Temp 98.1 F (36.7 C)   Ht 5' 6 (1.676 m)   Wt 162 lb 3.2 oz (73.6 kg)   SpO2 95% Comment: RA  BMI 26.18 kg/m  BMI Readings from Last 3 Encounters:  03/11/24 26.18 kg/m  07/13/23 26.63 kg/m  12/24/22 26.86 kg/m    General: elderly male appearing appears stated age.  Lungs: clear to auscultation bilaterally.  Heart: regular rate rhythm, no murmur appreciated.  Abdomen: non tender, non distended. Normal BS.  Neuro: axox3.  Moves all extremities.    Diagnostic Review:  Last metabolic panel Lab Results  Component Value Date   GLUCOSE 91 07/13/2023   NA 133 (L)  07/13/2023   K 3.8 07/13/2023   CL 102 07/13/2023   CO2 23 07/13/2023   BUN 15 07/13/2023   CREATININE 1.01 07/13/2023   GFRNONAA >60 07/13/2023   CALCIUM 8.4 (L) 07/13/2023   PROT 7.6 04/30/2022   ALBUMIN 4.0 04/30/2022   BILITOT 0.9 04/30/2022   ALKPHOS 44 04/30/2022   AST 30 04/30/2022   ALT 29 04/30/2022   ANIONGAP 8 07/13/2023       Assessment & Plan:   Assessment & Plan DOE (dyspnea on  exertion) Unclear cause. Unclear how significant it is as his ambulation is limited by knee pain.  - PFTs - CT chest PE protocol.  Orthostatic hypotension Will defer work up to PCP. Has BPPV. Makes it challenging to tease out his symptoms.  Will r/o PE as this can cause orthostatic hypotension.  If neg may consider MRA brain/neck to r/o posterior circulation obstruction which can cause dizziness.   Orders Placed This Encounter  Procedures   CT Angio Chest W/Cm &/Or Wo Cm   Pulmonary Function Test      Return in about 2 months (around 05/11/2024).   Kirklin Mcduffee, MD

## 2024-03-17 ENCOUNTER — Inpatient Hospital Stay
Admission: RE | Admit: 2024-03-17 | Discharge: 2024-03-17 | Discharge status: Home / Self Care | Source: Ambulatory Visit

## 2024-03-17 ENCOUNTER — Ambulatory Visit: Payer: Self-pay

## 2024-03-17 DIAGNOSIS — R0609 Other forms of dyspnea: Secondary | ICD-10-CM

## 2024-03-17 DIAGNOSIS — J479 Bronchiectasis, uncomplicated: Secondary | ICD-10-CM | POA: Diagnosis not present

## 2024-03-17 DIAGNOSIS — I951 Orthostatic hypotension: Secondary | ICD-10-CM

## 2024-03-17 MED ORDER — IOPAMIDOL (ISOVUE-370) INJECTION 76%
75.0000 mL | Freq: Once | INTRAVENOUS | Status: AC | PRN
  Administered 2024-03-17: 75 mL via INTRAVENOUS

## 2024-03-29 DIAGNOSIS — E559 Vitamin D deficiency, unspecified: Secondary | ICD-10-CM | POA: Diagnosis not present

## 2024-03-29 DIAGNOSIS — E782 Mixed hyperlipidemia: Secondary | ICD-10-CM | POA: Diagnosis not present

## 2024-03-29 DIAGNOSIS — E039 Hypothyroidism, unspecified: Secondary | ICD-10-CM | POA: Diagnosis not present

## 2024-03-29 DIAGNOSIS — R7303 Prediabetes: Secondary | ICD-10-CM | POA: Diagnosis not present

## 2024-03-29 DIAGNOSIS — R42 Dizziness and giddiness: Secondary | ICD-10-CM | POA: Diagnosis not present

## 2024-04-05 DIAGNOSIS — I10 Essential (primary) hypertension: Secondary | ICD-10-CM | POA: Diagnosis not present

## 2024-04-05 DIAGNOSIS — Z0000 Encounter for general adult medical examination without abnormal findings: Secondary | ICD-10-CM | POA: Diagnosis not present

## 2024-04-05 DIAGNOSIS — R413 Other amnesia: Secondary | ICD-10-CM | POA: Diagnosis not present

## 2024-04-05 DIAGNOSIS — I25810 Atherosclerosis of coronary artery bypass graft(s) without angina pectoris: Secondary | ICD-10-CM | POA: Diagnosis not present

## 2024-04-05 DIAGNOSIS — E782 Mixed hyperlipidemia: Secondary | ICD-10-CM | POA: Diagnosis not present

## 2024-04-05 DIAGNOSIS — R29898 Other symptoms and signs involving the musculoskeletal system: Secondary | ICD-10-CM | POA: Diagnosis not present

## 2024-04-05 DIAGNOSIS — R42 Dizziness and giddiness: Secondary | ICD-10-CM | POA: Diagnosis not present

## 2024-04-05 DIAGNOSIS — Z0001 Encounter for general adult medical examination with abnormal findings: Secondary | ICD-10-CM | POA: Diagnosis not present

## 2024-04-05 DIAGNOSIS — Z23 Encounter for immunization: Secondary | ICD-10-CM | POA: Diagnosis not present

## 2024-04-05 DIAGNOSIS — E559 Vitamin D deficiency, unspecified: Secondary | ICD-10-CM | POA: Diagnosis not present

## 2024-04-05 DIAGNOSIS — R0609 Other forms of dyspnea: Secondary | ICD-10-CM | POA: Diagnosis not present

## 2024-04-10 NOTE — Progress Notes (Incomplete)
 Impression/Assessment:  Grade group 3 prostate cancer, status post I-125 brachytherapy  9months ago.  Excellent PSA response thus far with no significant lower urinary tract symptom changes  BPH with stable LUTS  Plan:    History of Present Illness: This 78 year old male comes in today for follow-up of adenocarcinoma the prostate, treated.  3.19.2024: TRUS/Bx. PSA 7.3, prostate volume 30 mL, PSAD 0.24. 1/12 cores (right apex medial) revealed GS 4+3 pattern in 50% of core.  6.28.2024: Underwent I-125 brachytherapy and SpaceOAR  4.15.2025: PSA 3 mos ago 0.6. He denies significant lower urinary tract symptoms.  He has noted no blood in his urine or stool.  IPSS 7/2.  10.21.2025:   Past Medical History:  Diagnosis Date   Atherosclerosis    BPH (benign prostatic hyperplasia)    CAD in native artery    Dizziness    occ, no meds taken   Hyperlipidemia    Hypertension    Hypothyroidism    Mild cognitive impairment with memory loss    pt signs own concents per poa daughter eleanor bunker   Osteoarthritis of both knees    Overactive bladder    Prostate cancer (HCC)    Uses walker    Wears glasses     Past Surgical History:  Procedure Laterality Date   COLONOSCOPY WITH PROPOFOL  N/A 08/16/2020   Procedure: COLONOSCOPY WITH PROPOFOL ;  Surgeon: Cindie Carlin POUR, DO;  [facility];  Service: Endoscopy;  Laterality: N/A;  10:15am, per office - pt not able to come earlier due to transportation   CORONARY ARTERY BYPASS GRAFT     1997 6 vessel (Dr. Lucas)   leg surgery as a child     PROSTATE BIOPSY     RADIOACTIVE SEED IMPLANT N/A 12/19/2022   Procedure: RADIOACTIVE SEED IMPLANT/BRACHYTHERAPY IMPLANT;  Surgeon: Matilda Senior, MD;  Location: [facility];  Service: Urology;  Laterality: N/A;   SPACE OAR INSTILLATION N/A 12/19/2022   Procedure: SPACE OAR INSTILLATION;  Surgeon: Matilda Senior, MD;  Location: [facility];   Service: Urology;  Laterality: N/A;    Home Medications:  Allergies as of 04/12/2024       Reactions   Crestor [rosuvastatin]    Couldn't walk   Simvastatin         Medication List        Accurate as of April 10, 2024  6:51 PM. If you have any questions, ask your nurse or doctor.          albuterol 108 (90 Base) MCG/ACT inhaler Commonly known as: VENTOLIN HFA Inhale 2 puffs into the lungs every 6 (six) hours as needed.   aspirin EC 81 MG tablet Take 81 mg by mouth daily.   BLUE-EMU MAXIMUM STRENGTH EX Apply topically.   cholecalciferol 25 MCG (1000 UNIT) tablet Commonly known as: VITAMIN D3 Take 1,000 Units by mouth daily.   levothyroxine 25 MCG tablet Commonly known as: SYNTHROID Take 25 mcg by mouth daily.   meclizine 25 MG tablet Commonly known as: ANTIVERT Take 25 mg by mouth 3 (three) times daily as needed for dizziness or nausea.   metoprolol succinate 25 MG 24 hr tablet Commonly known as: TOPROL-XL Take 12.5 mg by mouth daily.   Myrbetriq  50 MG Tb24 tablet Generic drug: mirabegron  ER TAKE ONE TABLET BY MOUTH ONCE DAILY.   pravastatin 40 MG tablet Commonly known as: PRAVACHOL Take by mouth.   tamsulosin 0.4 MG Caps capsule Commonly known as: FLOMAX Take  0.4 mg by mouth daily after supper.        Allergies:  Allergies  Allergen Reactions   Crestor [Rosuvastatin]     Couldn't walk   Simvastatin     Family History  Problem Relation Age of Onset   Heart disease Father    Heart disease Brother    Colon cancer Neg Hx     Social History:  reports that he has never smoked. He has never used smokeless tobacco. He reports that he does not drink alcohol and does not use drugs.  ROS: A complete review of systems was performed.  All systems are negative except for pertinent findings as noted.  Physical Exam:  Vital signs in last 24 hours: There were no vitals taken for this visit. Constitutional:  Alert and oriented, No acute  distress Cardiovascular: Regular rate  Respiratory: Normal respiratory effort Neurologic: Grossly intact, no focal deficits Psychiatric: Normal mood and affect  I have reviewed prior pt notes  I have reviewed urinalysis results--clear today.  I have independently reviewed prior imaging- prostate ultrasound/volume  I have reviewed prior PSA as well as pathology results  I have reviewed IPSS sheet

## 2024-04-12 ENCOUNTER — Ambulatory Visit: Admitting: Urology

## 2024-04-12 VITALS — BP 103/63 | HR 79

## 2024-04-12 DIAGNOSIS — N401 Enlarged prostate with lower urinary tract symptoms: Secondary | ICD-10-CM

## 2024-04-12 DIAGNOSIS — R350 Frequency of micturition: Secondary | ICD-10-CM

## 2024-04-12 DIAGNOSIS — Z8546 Personal history of malignant neoplasm of prostate: Secondary | ICD-10-CM

## 2024-04-12 LAB — URINALYSIS, ROUTINE W REFLEX MICROSCOPIC
Bilirubin, UA: NEGATIVE
Glucose, UA: NEGATIVE
Ketones, UA: NEGATIVE
Leukocytes,UA: NEGATIVE
Nitrite, UA: NEGATIVE
Protein,UA: NEGATIVE
RBC, UA: NEGATIVE
Specific Gravity, UA: 1.025 (ref 1.005–1.030)
Urobilinogen, Ur: 0.2 mg/dL (ref 0.2–1.0)
pH, UA: 5.5 (ref 5.0–7.5)

## 2024-04-13 ENCOUNTER — Ambulatory Visit: Payer: Self-pay | Admitting: Urology

## 2024-04-13 LAB — PSA: Prostate Specific Ag, Serum: 0.5 ng/mL (ref 0.0–4.0)

## 2024-04-14 ENCOUNTER — Other Ambulatory Visit (HOSPITAL_COMMUNITY): Payer: Self-pay | Admitting: Family Medicine

## 2024-04-14 DIAGNOSIS — R42 Dizziness and giddiness: Secondary | ICD-10-CM

## 2024-04-20 ENCOUNTER — Ambulatory Visit: Admitting: Neurology

## 2024-04-22 ENCOUNTER — Ambulatory Visit (HOSPITAL_COMMUNITY)
Admission: RE | Admit: 2024-04-22 | Discharge: 2024-04-22 | Disposition: A | Discharge status: Home / Self Care | Source: Ambulatory Visit | Attending: Family Medicine | Admitting: Family Medicine

## 2024-04-22 DIAGNOSIS — I708 Atherosclerosis of other arteries: Secondary | ICD-10-CM | POA: Diagnosis not present

## 2024-04-22 DIAGNOSIS — I6502 Occlusion and stenosis of left vertebral artery: Secondary | ICD-10-CM | POA: Insufficient documentation

## 2024-04-22 DIAGNOSIS — I771 Stricture of artery: Secondary | ICD-10-CM | POA: Insufficient documentation

## 2024-04-22 DIAGNOSIS — R42 Dizziness and giddiness: Secondary | ICD-10-CM | POA: Insufficient documentation

## 2024-04-22 MED ORDER — GADOBUTROL 1 MMOL/ML IV SOLN
7.5000 mL | Freq: Once | INTRAVENOUS | Status: AC | PRN
  Administered 2024-04-22: 7.5 mL via INTRAVENOUS

## 2024-05-05 ENCOUNTER — Ambulatory Visit

## 2024-05-10 ENCOUNTER — Telehealth: Payer: Self-pay

## 2024-05-10 ENCOUNTER — Ambulatory Visit

## 2024-05-10 VITALS — BP 133/66 | HR 71 | Ht 66.0 in | Wt 163.0 lb

## 2024-05-10 DIAGNOSIS — R0609 Other forms of dyspnea: Secondary | ICD-10-CM

## 2024-05-10 DIAGNOSIS — J479 Bronchiectasis, uncomplicated: Secondary | ICD-10-CM | POA: Diagnosis not present

## 2024-05-10 MED ORDER — ALBUTEROL SULFATE HFA 108 (90 BASE) MCG/ACT IN AERS: 2.0000 | INHALATION_SPRAY | Freq: Four times a day (QID) | RESPIRATORY_TRACT | 5 refills | Status: AC | PRN

## 2024-05-10 MED ORDER — FLUTICASONE-SALMETEROL 100-50 MCG/ACT IN AEPB: 1.0000 | INHALATION_SPRAY | Freq: Two times a day (BID) | RESPIRATORY_TRACT | 5 refills | Status: DC

## 2024-05-10 NOTE — Patient Instructions (Signed)
  VISIT SUMMARY: During your visit, we discussed your shortness of breath with activity, dizziness, and knee pain. We reviewed your recent CT scans and MRI results, and we have made some changes to your treatment plan to help manage your symptoms.  YOUR PLAN: -DYSPNEA ON EXERTION: Dyspnea on exertion means experiencing shortness of breath during physical activity. This can be due to various reasons, including deconditioning and age-related changes. We have prescribed an Advair inhaler for you to use one puff twice daily and an albuterol inhaler for use as needed. Please remember to gargle or drink water  after using Advair to prevent oral thrush. We will reassess your condition in three months to see how you are responding to the inhalers.  -MILD BRONCHIECTASIS: Bronchiectasis is a condition where the airways in your lungs become widened and scarred, often due to past infections. This was noted on your CT scan. We will continue to monitor this condition.  INSTRUCTIONS: Please follow up in three months to assess your response to the inhalers. Additionally, continue with your scheduled evaluation at [facility] for your carotid arteries.                      Contains text generated by Abridge.                                 Contains text generated by Abridge.

## 2024-05-10 NOTE — Telephone Encounter (Signed)
 Copied from CRM [id number]. Topic: Clinical - Medication Question >> May 10, 2024 12:34 PM Corey Glover wrote: Reason for CRM: Pt daughter Corey Glover called regarding samples, pt recently had an appt on today and stated the pt was supposed to have samples before he left the office, Melissa stated she was still close by the office and she would go back regarding the samples, if not please f/u with pt regarding samples for pt.   Pts daughter presented in office for samples. Pt was prescribed Advair and albuterol and these were sent to the pharmacy. Pt and his daughter were made aware of this. Nfn

## 2024-05-10 NOTE — Progress Notes (Signed)
 New Patient Pulmonology Office Visit   Subjective:  Patient ID: Corey Glover, male    DOB: October 14, 1945  MRN: [id number]  Referred by: Shona Norleen PEDLAR, MD  CC:  Chief Complaint  Patient presents with   Follow-up    HPI Corey Glover is a 78 y.o. male non smoker with CAD s/p CABG, HTN, allergic rhinitis, hypothyroidism, BPPV, prostate CA s/p brachytherapy, presents for evaluation of SOB on E.   03/11/24> SOB on E w CP at rest. Lightheadedness>refer to PCP.  CTPE/PFT ordered.   Discussed the use of AI scribe software for clinical note transcription with the patient, who gave verbal consent to proceed.  History of Present Illness   Corey Glover is a 78 year old male who presents with dyspnea on exertion.  He experiences significant dyspnea on exertion. He becomes short of breath and easily fatigued with minimal activity, such as going to town or the grocery store, impacting his ability to perform daily activities.  He has a history of dizziness, particularly when turning over in bed, which prompted an MRI of the brain. The MRI showed mild blockages and stenosis. He is scheduled for further evaluation at [facility] due to concerns about his carotid arteries, following a CT scan of the neck.  No current chest pain, but knee pain contributes to his difficulty in mobility. He also experiences dizziness and weakness, which are being investigated by his doctors.  He has been exposed to smoke but has never been a smoker. A CT scan of the chest showed some changes, which may be related to a past infection. He denies coughing, choking on food, or producing phlegm.  He has an inhaler at home but has not used it and is unsure if it is still in date.       Lung Health: Functional status: limited ambulation due to knee pain. Sob is not the limiting factor. Few feet.  Smoking: non smoker.  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 No pets.    Tests: ECHO 7/25: EF 60-65%, RVEF normal. No valvular abn.  CXR Jan 2025: reviewed by me. Normal.  CT chest 02/2024: Reviewed by me: Bronchiectasis and right supra segment with some bronchial ectasis and left lower lobe segment.  Otherwise unremarkable parenchyma.  Radiographic evidence of elevated right heart pressure   Allergies: Crestor [rosuvastatin] and Simvastatin  Current Outpatient Medications:    aspirin EC 81 MG tablet, Take 81 mg by mouth daily., Disp: , Rfl:    cholecalciferol (VITAMIN D3) 25 MCG (1000 UNIT) tablet, Take 1,000 Units by mouth daily., Disp: , Rfl:    fluticasone-salmeterol (ADVAIR) 100-50 MCG/ACT AEPB, Inhale 1 puff into the lungs 2 (two) times daily., Disp: 1 each, Rfl: 5   levothyroxine (SYNTHROID) 25 MCG tablet, Take 25 mcg by mouth daily., Disp: , Rfl:    meclizine (ANTIVERT) 25 MG tablet, Take 25 mg by mouth 3 (three) times daily as needed for dizziness or nausea., Disp: , Rfl:    Menthol, Topical Analgesic, (BLUE-EMU MAXIMUM STRENGTH EX), Apply topically. (Patient taking differently: Apply topically.), Disp: , Rfl:    metoprolol succinate (TOPROL-XL) 25 MG 24 hr tablet, Take 12.5 mg by mouth daily., Disp: , Rfl:    MYRBETRIQ  50 MG TB24 tablet, TAKE ONE TABLET BY MOUTH ONCE DAILY., Disp: 30 tablet, Rfl: 11   pravastatin (PRAVACHOL) 40 MG tablet, Take by mouth., Disp: , Rfl:    tamsulosin (FLOMAX) 0.4 MG CAPS capsule,  Take 0.4 mg by mouth daily after supper., Disp: , Rfl:    albuterol (VENTOLIN HFA) 108 (90 Base) MCG/ACT inhaler, Inhale 2 puffs into the lungs every 6 (six) hours as needed., Disp: 8.5 g, Rfl: 5 Past Medical History:  Diagnosis Date   Atherosclerosis    BPH (benign prostatic hyperplasia)    CAD in native artery    Dizziness    occ, no meds taken   Hyperlipidemia    Hypertension    Hypothyroidism    Mild cognitive impairment with memory loss    pt signs own concents per poa daughter eleanor bunker   Osteoarthritis of both knees     Overactive bladder    Prostate cancer (HCC)    Uses walker    Wears glasses    Past Surgical History:  Procedure Laterality Date   COLONOSCOPY WITH PROPOFOL  N/A 08/16/2020   Procedure: COLONOSCOPY WITH PROPOFOL ;  Surgeon: Cindie Carlin POUR, DO;  [facility];  Service: Endoscopy;  Laterality: N/A;  10:15am, per office - pt not able to come earlier due to transportation   CORONARY ARTERY BYPASS GRAFT     1997 6 vessel (Dr. Lucas)   leg surgery as a child     PROSTATE BIOPSY     RADIOACTIVE SEED IMPLANT N/A 12/19/2022   Procedure: RADIOACTIVE SEED IMPLANT/BRACHYTHERAPY IMPLANT;  Surgeon: Matilda Senior, MD;  Location: [facility];  Service: Urology;  Laterality: N/A;   SPACE OAR INSTILLATION N/A 12/19/2022   Procedure: SPACE OAR INSTILLATION;  Surgeon: Matilda Senior, MD;  Location: [facility];  Service: Urology;  Laterality: N/A;   Family History  Problem Relation Age of Onset   Heart disease Father    Heart disease Brother    Colon cancer Neg Hx    Social History   Socioeconomic History   Marital status: Single    Spouse name: Not on file   Number of children: Not on file   Years of education: Not on file   Highest education level: Not on file  Occupational History   Not on file  Tobacco Use   Smoking status: Never   Smokeless tobacco: Never  Vaping Use   Vaping status: Never Used  Substance and Sexual Activity   Alcohol use: No   Drug use: No   Sexual activity: Not Currently  Other Topics Concern   Not on file  Social History Narrative   Divorced since 2000.Son lives with him.Retired ,used to work in publishing rights manager.Ex-military.   Social Drivers of Corporate Investment Banker Strain: Low Risk  (07/21/2023)   Overall Financial Resource Strain (CARDIA)    Difficulty of Paying Living Expenses: Not very hard  Food Insecurity: No Food Insecurity (07/21/2023)   Hunger Vital Sign    Worried About Running Out of Food in  the Last Year: Never true    Ran Out of Food in the Last Year: Never true  Transportation Needs: No Transportation Needs (07/21/2023)   PRAPARE - Administrator, Civil Service (Medical): No    Lack of Transportation (Non-Medical): No  Physical Activity: Not on file  Stress: Not on file  Social Connections: Not on file  Intimate Partner Violence: Not At Risk (10/21/2022)   Humiliation, Afraid, Rape, and Kick questionnaire    Fear of Current or Ex-Partner: No    Emotionally Abused: No    Physically Abused: No    Sexually Abused: No       Objective:  BP 133/66   Pulse 71   Ht 5' 6 (1.676 m)   Wt 163 lb (73.9 kg)   SpO2 96%   BMI 26.31 kg/m  BMI Readings from Last 3 Encounters:  05/10/24 26.31 kg/m  03/11/24 26.18 kg/m  07/13/23 26.63 kg/m    General: elderly male appearing appears stated age.  Lungs: Mild rales on posterior lung field auscultation. Heart: regular rate rhythm, no murmur appreciated.  Abdomen: non tender, non distended. Normal BS.  Neuro: axox3.  Moves all extremities.  No significant leg edema.   Diagnostic Review:  Last metabolic panel Lab Results  Component Value Date   GLUCOSE 91 07/13/2023   NA 133 (L) 07/13/2023   K 3.8 07/13/2023   CL 102 07/13/2023   CO2 23 07/13/2023   BUN 15 07/13/2023   CREATININE 1.01 07/13/2023   GFRNONAA >60 07/13/2023   CALCIUM 8.4 (L) 07/13/2023   PROT 7.6 04/30/2022   ALBUMIN 4.0 04/30/2022   BILITOT 0.9 04/30/2022   ALKPHOS 44 04/30/2022   AST 30 04/30/2022   ALT 29 04/30/2022   ANIONGAP 8 07/13/2023       Assessment & Plan:   Assessment & Plan DOE (dyspnea on exertion)  Bronchiectasis without complication (HCC)  Assessment and Plan    Dyspnea on exertion: Mild Bronchiectasis: Chronic dyspnea with no clear etiology. CT scan shows mild bronchiectasis, likely from past infection. Differential for dyspnea includes deconditioning and age-related changes. Trial of inhalers for DOE.   Patient would like to postpone his PFTs for later date.  - Prescribed Advair inhaler, one puff twice daily. - Prescribed albuterol inhaler for as-needed use. - Advised to gargle or drink water  after using Advair to prevent oral thrush. - Postponed lung function test until further evaluation of dyspnea. - Scheduled follow-up in three months to assess response to inhalers.  Mild bronchiectasis Noted on CT scan, likely secondary to past infection.  Asymptomatic.        I personally spent a total of 25 minutes in the care of the patient today including preparing to see the patient, getting/reviewing separately obtained history, performing a medically appropriate exam/evaluation, counseling and educating, and referring and communicating with other health care professionals.    Return in about 3 months (around 08/10/2024).   Desiree Daise, MD

## 2024-05-11 NOTE — Progress Notes (Signed)
 Cardiology Office Note:   Date:  05/11/2024  ID:  BRAYLIN XU, DOB 10-15-1945, MRN [id number] PCP: Shona Norleen PEDLAR, MD  [facility] Providers Cardiologist:  None { Chief Complaint: No chief complaint on file.     History of Present Illness:   Corey Glover is a 78 y.o. male with a PMH of CAD s/p CABG (1997), HTN, HLD, hypothyroidism and mild cognitive impairment who presents as a new patient referral by Dr. Norleen Shona for further evaluation of CAD.  The patient was seen remotely in 2018 by cardiology.  The patient has a remote history of CABG in 1997 which he underwent after experiencing sudden cardiac death and being found to have multivessel CAD.  He has not seen cardiology since 2018.   Past Medical History:  Diagnosis Date   Atherosclerosis    BPH (benign prostatic hyperplasia)    CAD in native artery    Dizziness    occ, no meds taken   Hyperlipidemia    Hypertension    Hypothyroidism    Mild cognitive impairment with memory loss    pt signs own concents per poa daughter eleanor bunker   Osteoarthritis of both knees    Overactive bladder    Prostate cancer (HCC)    Uses walker    Wears glasses      Studies Reviewed:    EKG: ***       Cardiac Studies & Procedures   ______________________________________________________________________________________________   STRESS TESTS  NM MYOCAR MULTI W/SPECT W 02/06/2017  Narrative  Blood pressure demonstrated a normal response to exercise.  There was no ST segment deviation noted during stress.  The study is normal.  This is a low risk study.  Nuclear stress EF: 62%.   ECHOCARDIOGRAM  ECHOCARDIOGRAM COMPLETE 01/05/2024  Narrative ECHOCARDIOGRAM REPORT    Patient Name:   Corey Glover Date of Exam: 01/05/2024 Medical Rec #:  [id number]         Height:       66.0 in Accession #:    [id number]        Weight:       165.0 lb Date of Birth:  1946-01-31         BSA:          1.843  m Patient Age:    77 years          BP:           112/71 mmHg Patient Gender: M                 HR:           60 bpm. Exam Location:  Outpatient  Procedure: 2D Echo, Strain Analysis, Cardiac Doppler and Color Doppler (Both Spectral and Color Flow Doppler were utilized during procedure).  Indications:    Dyspnea R06.09  History:        Patient has no prior history of Echocardiogram examinations. CAD, Prior CABG; Risk Factors:Hypertension, Pre-diabetes and Dyslipidemia.  Sonographer:    Koleen Popper RDCS Referring Phys: [id number] DEREK HUFFMAN   Sonographer Comments: Global longitudinal strain was attempted. IMPRESSIONS   1. Left ventricular ejection fraction, by estimation, is 60 to 65%. The left ventricle has normal function. The left ventricle has no regional wall motion abnormalities. Left ventricular diastolic parameters were normal. The global longitudinal strain is normal. 2. Right ventricular systolic function is low normal. The right ventricular size is normal. There is normal pulmonary artery systolic pressure. 3. The  mitral valve is normal in structure. Trivial mitral valve regurgitation. No evidence of mitral stenosis. 4. The aortic valve is grossly normal. Aortic valve regurgitation is mild. No aortic stenosis is present. 5. Pulmonic valve regurgitation is moderate. 6. The inferior vena cava is normal in size with <50% respiratory variability, suggesting right atrial pressure of 8 mmHg.  Comparison(s): No prior Echocardiogram.  Conclusion(s)/Recommendation(s): Normal biventricular function without evidence of hemodynamically significant valvular heart disease.  FINDINGS Left Ventricle: Left ventricular ejection fraction, by estimation, is 60 to 65%. The left ventricle has normal function. The left ventricle has no regional wall motion abnormalities. Strain was performed and the global longitudinal strain is normal. The global longitudinal strain is normal despite  suboptimal segment tracking. The left ventricular internal cavity size was normal in size. There is no left ventricular hypertrophy. Left ventricular diastolic parameters were normal.  Right Ventricle: The right ventricular size is normal. No increase in right ventricular wall thickness. Right ventricular systolic function is low normal. There is normal pulmonary artery systolic pressure. The tricuspid regurgitant velocity is 2.30 m/s, and with an assumed right atrial pressure of 8 mmHg, the estimated right ventricular systolic pressure is 29.2 mmHg.  Left Atrium: Left atrial size was normal in size.  Right Atrium: Right atrial size was normal in size.  Pericardium: There is no evidence of pericardial effusion.  Mitral Valve: The mitral valve is normal in structure. Trivial mitral valve regurgitation. No evidence of mitral valve stenosis.  Tricuspid Valve: The tricuspid valve is grossly normal. Tricuspid valve regurgitation is mild . No evidence of tricuspid stenosis.  Aortic Valve: The aortic valve is grossly normal. Aortic valve regurgitation is mild. No aortic stenosis is present.  Pulmonic Valve: The pulmonic valve was not well visualized. Pulmonic valve regurgitation is moderate. No evidence of pulmonic stenosis.  Aorta: The aortic root, ascending aorta, aortic arch and descending aorta are all structurally normal, with no evidence of dilitation or obstruction.  Venous: The inferior vena cava is normal in size with less than 50% respiratory variability, suggesting right atrial pressure of 8 mmHg.  IAS/Shunts: The atrial septum is grossly normal.   LEFT VENTRICLE PLAX 2D LVIDd:         4.30 cm   Diastology LVIDs:         2.70 cm   LV e' medial:    8.16 cm/s LV PW:         0.90 cm   LV E/e' medial:  6.9 LV IVS:        0.90 cm   LV e' lateral:   11.10 cm/s LVOT diam:     2.00 cm   LV E/e' lateral: 5.1 LV SV:         51 LV SV Index:   28 LVOT Area:     3.14 cm   RIGHT  VENTRICLE            IVC RV Basal diam:  3.40 cm    IVC diam: 2.00 cm RV S prime:     9.36 cm/s TAPSE (M-mode): 1.3 cm  LEFT ATRIUM             Index        RIGHT ATRIUM           Index LA diam:        3.80 cm 2.06 cm/m   RA Area:     12.00 cm LA Vol (A2C):   26.6 ml 14.43 ml/m  RA Volume:  27.00 ml  14.65 ml/m LA Vol (A4C):   29.0 ml 15.74 ml/m LA Biplane Vol: 28.1 ml 15.25 ml/m AORTIC VALVE LVOT Vmax:   82.30 cm/s LVOT Vmean:  50.800 cm/s LVOT VTI:    0.162 m  AORTA Ao Root diam: 2.80 cm Ao Asc diam:  3.30 cm  MITRAL VALVE               TRICUSPID VALVE MV Area (PHT): 3.60 cm    TR Peak grad:   21.2 mmHg MV Decel Time: 211 msec    TR Vmax:        230.00 cm/s MV E velocity: 56.10 cm/s MV A velocity: 44.00 cm/s  SHUNTS MV E/A ratio:  1.28        Systemic VTI:  0.16 m Systemic Diam: 2.00 cm  Shelda Bruckner MD Electronically signed by Shelda Bruckner MD Signature Date/Time: 01/05/2024/3:00:59 PM    Final          ______________________________________________________________________________________________      Risk Assessment/Calculations:   {Does this patient have ATRIAL FIBRILLATION?:[phone]}          Physical Exam:     VS:  There were no vitals taken for this visit. ***    Wt Readings from Last 3 Encounters:  05/10/24 163 lb (73.9 kg)  03/11/24 162 lb 3.2 oz (73.6 kg)  07/13/23 165 lb (74.8 kg)     GEN: Well nourished, well developed, in no acute distress NECK: No JVD; No carotid bruits CARDIAC: ***RRR, no murmurs, rubs, gallops RESPIRATORY:  Clear to auscultation without rales, wheezing or rhonchi  ABDOMEN: Soft, non-tender, non-distended, normal bowel sounds EXTREMITIES:  Warm and well perfused, no edema; No deformity, 2+ radial pulses PSYCH: Normal mood and affect   Assessment & Plan       {Are you ordering a CV Procedure (e.g. stress test, cath, DCCV, TEE, etc)?   Press F2        :[id number]}   This note was written  with the assistance of a dictation microphone or AI dictation software. Please excuse any typos or grammatical errors.   Signed, Georganna Archer, MD 05/11/2024 9:57 PM    [facility]

## 2024-05-12 ENCOUNTER — Encounter: Payer: Self-pay | Admitting: Student in an Organized Health Care Education/Training Program

## 2024-05-12 ENCOUNTER — Ambulatory Visit
Attending: Student in an Organized Health Care Education/Training Program | Admitting: Student in an Organized Health Care Education/Training Program

## 2024-05-12 VITALS — BP 110/60 | HR 63 | Ht 66.0 in | Wt 164.8 lb

## 2024-05-12 DIAGNOSIS — R42 Dizziness and giddiness: Secondary | ICD-10-CM | POA: Diagnosis not present

## 2024-05-12 DIAGNOSIS — I771 Stricture of artery: Secondary | ICD-10-CM

## 2024-05-12 DIAGNOSIS — E782 Mixed hyperlipidemia: Secondary | ICD-10-CM | POA: Diagnosis not present

## 2024-05-12 DIAGNOSIS — I2510 Atherosclerotic heart disease of native coronary artery without angina pectoris: Secondary | ICD-10-CM

## 2024-05-12 DIAGNOSIS — I10 Essential (primary) hypertension: Secondary | ICD-10-CM

## 2024-05-12 NOTE — Assessment & Plan Note (Signed)
No anginal symptoms. Continue aspirin 81mg daily

## 2024-05-12 NOTE — Patient Instructions (Addendum)
 Medication Instructions:  Your physician recommends that you continue on your current medications as directed. Please refer to the Current Medication list given to you today.  *If you need a refill on your cardiac medications before your next appointment, please call your pharmacy*  Lab Work: Today-Lipids If you have labs (blood work) drawn today and your tests are completely normal, you will receive your results only by: MyChart Message (if you have MyChart) OR A paper copy in the mail If you have any lab test that is abnormal or we need to change your treatment, we will call you to review the results.   Follow-Up: At [facility], you and your health needs are our priority.  As part of our continuing mission to provide you with exceptional heart care, our providers are all part of one team.  This team includes your primary Cardiologist (physician) and Advanced Practice Providers or APPs (Physician Assistants and Nurse Practitioners) who all work together to provide you with the care you need, when you need it.  Your next appointment:   6 month(s)  Provider:    Georganna Archer, MD

## 2024-05-12 NOTE — Assessment & Plan Note (Signed)
-   The etiology of his dizziness is unclear to me. -He had a normal echocardiogram a couple months ago and his symptoms do not sound like they are related to his underlying CAD. -The question is his subclavian artery stenosis in any way related to his dizziness?  Sometimes patients can get subclavian steal syndrome with left-sided subclavian artery stenoses in the setting of a prior CABG.  I have not personally heard of this from right sided subclavian artery stenosis. -Close the stenosis is 60% which I would expected to have to be more stenotic to cause significant symptoms. -We checked blood pressures on both arms and there is no discrepancy between the 2. - I will discuss if there is any utility in stenting a lesion such as this with one of my colleagues to get interventional, but I suspect not. - For now, no additional cardiovascular testing for his dizziness at this time and will hold off on pursuing LHC unless his symptoms become more convincingly cardiac in etiology. Follow-up in 6 months

## 2024-05-12 NOTE — Assessment & Plan Note (Signed)
-   BP is at goal.  No changes. Continue metoprolol succinate 12.5 mg daily.

## 2024-05-13 ENCOUNTER — Ambulatory Visit: Payer: Self-pay | Admitting: Student in an Organized Health Care Education/Training Program

## 2024-05-13 LAB — LIPID PANEL
Chol/HDL Ratio: 3.1 ratio (ref 0.0–5.0)
Cholesterol, Total: 122 mg/dL (ref 100–199)
HDL: 40 mg/dL (ref >39)
LDL Chol Calc (NIH): 69 mg/dL (ref 0–99)
Triglycerides: 61 mg/dL (ref 0–149)
VLDL Cholesterol Cal: 13 mg/dL (ref 5–40)

## 2024-05-16 MED ORDER — FLUTICASONE-SALMETEROL 45-21 MCG/ACT IN AERO: 2.0000 | INHALATION_SPRAY | Freq: Two times a day (BID) | RESPIRATORY_TRACT | 12 refills | Status: DC

## 2024-05-16 NOTE — Telephone Encounter (Signed)
ATC x1.  LMTCB. 

## 2024-05-16 NOTE — Addendum Note (Signed)
 Addended by: Kerrington Sova D on: 05/16/2024 12:15 PM   Modules accepted: Orders

## 2024-05-16 NOTE — Telephone Encounter (Signed)
 Copied from CRM [id number]. Topic: Clinical - Medication Question >> May 16, 2024  8:47 AM Nathanel DEL wrote: Reason for CRM: Eleanor, daughter calling to ask if there is an alternate to the advair inhaler?  Pt would like the old kind where you just push down. Unable to use the disc correctly.  She said they have showed him several times, and he cannot do it.  He is wasting it. Please call in to [facility] [place] [place] - [place] [address]   Dr. Theodoro can you please advise.

## 2024-05-18 ENCOUNTER — Encounter

## 2024-05-18 ENCOUNTER — Ambulatory Visit

## 2024-05-18 ENCOUNTER — Telehealth: Payer: Self-pay

## 2024-05-18 DIAGNOSIS — J479 Bronchiectasis, uncomplicated: Secondary | ICD-10-CM

## 2024-05-18 DIAGNOSIS — R0609 Other forms of dyspnea: Secondary | ICD-10-CM

## 2024-05-18 MED ORDER — BUDESONIDE-FORMOTEROL FUMARATE 160-4.5 MCG/ACT IN AERO: 2.0000 | INHALATION_SPRAY | Freq: Two times a day (BID) | RESPIRATORY_TRACT | 6 refills | Status: AC

## 2024-05-18 NOTE — Telephone Encounter (Signed)
 ATCx2 LVMTCB. Sending MyChart message so pt is aware that the Symbicort  has been called in before the long weekend.

## 2024-05-18 NOTE — Telephone Encounter (Signed)
 Copied from CRM #[id number]. Topic: Clinical - Prescription Issue >> May 18, 2024 10:38 AM Corean SAUNDERS wrote: Reason for CRM: Patients daughter Eleanor is requesting Dr. Theodoro to please change the inhaler prescription to Symbicort  as the is what will be covered by insurance.  Melissa states she requested this yesterday but there has not been an update.   Called and spoke to pt's daughter, Eleanor [facility]) She is requesting to change inhaler from Advair to Symbicort  due to insurance. Dr. Pawar, please advise if this is an appropriate alternate?

## 2024-05-18 NOTE — Telephone Encounter (Signed)
 Copied from CRM [id number]. Topic: Clinical - Medication Question >> May 17, 2024  3:48 PM Rozanna MATSU wrote: The daughter stated the insurance company will not pay for the advair inhaler, but they will pay for the Symbicort   ATC x1. LMTCB

## 2024-06-20 ENCOUNTER — Encounter (HOSPITAL_COMMUNITY): Payer: Self-pay

## 2024-06-20 ENCOUNTER — Emergency Department (HOSPITAL_COMMUNITY)
Admission: EM | Admit: 2024-06-20 | Discharge: 2024-06-21 | Disposition: A | Discharge status: Home / Self Care | Attending: Emergency Medicine | Admitting: Emergency Medicine

## 2024-06-20 ENCOUNTER — Other Ambulatory Visit: Payer: Self-pay

## 2024-06-20 DIAGNOSIS — R413 Other amnesia: Secondary | ICD-10-CM | POA: Insufficient documentation

## 2024-06-20 DIAGNOSIS — Z7982 Long term (current) use of aspirin: Secondary | ICD-10-CM | POA: Insufficient documentation

## 2024-06-20 DIAGNOSIS — M546 Pain in thoracic spine: Secondary | ICD-10-CM | POA: Diagnosis present

## 2024-06-20 DIAGNOSIS — M545 Low back pain, unspecified: Secondary | ICD-10-CM | POA: Diagnosis not present

## 2024-06-20 NOTE — ED Triage Notes (Signed)
 Pt reports lower back pain that started 2 days ago, denies urinary symptoms.

## 2024-06-21 ENCOUNTER — Emergency Department (HOSPITAL_COMMUNITY)

## 2024-06-21 LAB — URINALYSIS, W/ REFLEX TO CULTURE (INFECTION SUSPECTED)
Bacteria, UA: NONE SEEN
Bilirubin Urine: NEGATIVE
Glucose, UA: NEGATIVE mg/dL
Hgb urine dipstick: NEGATIVE
Ketones, ur: NEGATIVE mg/dL
Leukocytes,Ua: NEGATIVE
Nitrite: NEGATIVE
Protein, ur: NEGATIVE mg/dL
Specific Gravity, Urine: 1.021 (ref 1.005–1.030)
pH: 5 (ref 5.0–8.0)

## 2024-06-21 LAB — CBC WITH DIFFERENTIAL/PLATELET
Abs Immature Granulocytes: 0.02 K/uL (ref 0.00–0.07)
Basophils Absolute: 0 K/uL (ref 0.0–0.1)
Basophils Relative: 0 %
Eosinophils Absolute: 0.1 K/uL (ref 0.0–0.5)
Eosinophils Relative: 2 %
HCT: 44.5 % (ref 39.0–52.0)
Hemoglobin: 15.2 g/dL (ref 13.0–17.0)
Immature Granulocytes: 0 %
Lymphocytes Relative: 21 %
Lymphs Abs: 1.7 K/uL (ref 0.7–4.0)
MCH: 29.6 pg (ref 26.0–34.0)
MCHC: 34.2 g/dL (ref 30.0–36.0)
MCV: 86.7 fL (ref 80.0–100.0)
Monocytes Absolute: 0.8 K/uL (ref 0.1–1.0)
Monocytes Relative: 10 %
Neutro Abs: 5.5 K/uL (ref 1.7–7.7)
Neutrophils Relative %: 67 %
Platelets: 218 K/uL (ref 150–400)
RBC: 5.13 MIL/uL (ref 4.22–5.81)
RDW: 13.8 % (ref 11.5–15.5)
WBC: 8.2 K/uL (ref 4.0–10.5)
nRBC: 0 % (ref 0.0–0.2)

## 2024-06-21 LAB — COMPREHENSIVE METABOLIC PANEL WITH GFR
ALT: 37 U/L (ref 0–44)
AST: 28 U/L (ref 15–41)
Albumin: 3.8 g/dL (ref 3.5–5.0)
Alkaline Phosphatase: 77 U/L (ref 38–126)
Anion gap: 9 (ref 5–15)
BUN: 14 mg/dL (ref 8–23)
CO2: 24 mmol/L (ref 22–32)
Calcium: 8.9 mg/dL (ref 8.9–10.3)
Chloride: 104 mmol/L (ref 98–111)
Creatinine, Ser: 0.9 mg/dL (ref 0.61–1.24)
GFR, Estimated: >60 mL/min
Glucose, Bld: 114 mg/dL — ABNORMAL HIGH (ref 70–99)
Potassium: 3.9 mmol/L (ref 3.5–5.1)
Sodium: 138 mmol/L (ref 135–145)
Total Bilirubin: 0.5 mg/dL (ref 0.0–1.2)
Total Protein: 6.4 g/dL — ABNORMAL LOW (ref 6.5–8.1)

## 2024-06-21 MED ORDER — IBUPROFEN 800 MG PO TABS
800.0000 mg | ORAL_TABLET | Freq: Once | ORAL | Status: AC
  Administered 2024-06-21: 800 mg via ORAL
  Filled 2024-06-21: qty 1

## 2024-06-21 MED ORDER — OXYCODONE-ACETAMINOPHEN 5-325 MG PO TABS
1.0000 | ORAL_TABLET | Freq: Once | ORAL | Status: AC
  Administered 2024-06-21: 1 via ORAL
  Filled 2024-06-21: qty 1

## 2024-06-21 NOTE — ED Provider Notes (Addendum)
 " [place] EMERGENCY DEPARTMENT AT [facility] Provider Note   CSN: [id number] Arrival date & time: 06/20/24  2329     Patient presents with: Back Pain   Corey Glover is a 78 y.o. male.   78 year old male presents ER today secondary to back pain.  Patient states that when he woke up in [place] he had a sharp pain in middle of his back when he rolled over.  Seems to have improved quite a bit now but still lingering a little bit.  Denies any urinary symptoms, cancer history, recent infections, diabetes or other red flags.  No trauma.  Of note patient is not oriented to time.  He thinks it is January 2003 he does realize we just had Christians.  Does not know who the president is.  I called his daughter as daughter states that his memory has been like this for quite a while.  He does live by himself but has multiple people that check on him and help him out through the week.   Back Pain      Prior to Admission medications  Medication Sig Start Date End Date Taking? Authorizing Provider  albuterol  (VENTOLIN  HFA) 108 (90 Base) MCG/ACT inhaler Inhale 2 puffs into the lungs every 6 (six) hours as needed. 05/10/24   Theodoro Lakes, MD  aspirin EC 81 MG tablet Take 81 mg by mouth daily.    [provider]  budesonide -formoterol  (SYMBICORT ) 160-4.5 MCG/ACT inhaler Inhale 2 puffs into the lungs 2 (two) times daily. 05/18/24   Theodoro Lakes, MD  cholecalciferol (VITAMIN D3) 25 MCG (1000 UNIT) tablet Take 1,000 Units by mouth daily.    [provider]  levothyroxine (SYNTHROID) 25 MCG tablet Take 25 mcg by mouth daily. 09/15/22   [provider]  meclizine (ANTIVERT) 25 MG tablet Take 25 mg by mouth 3 (three) times daily as needed for dizziness or nausea.    [provider]  Menthol, Topical Analgesic, (BLUE-EMU MAXIMUM STRENGTH EX) Apply topically.    [provider]  metoprolol succinate (TOPROL-XL) 25 MG 24 hr tablet Take 12.5 mg by  mouth daily.    [provider]  MYRBETRIQ  50 MG TB24 tablet TAKE ONE TABLET BY MOUTH ONCE DAILY. 09/29/23   Matilda Senior, MD  pravastatin (PRAVACHOL) 40 MG tablet Take by mouth. 04/02/21   [provider]  tamsulosin (FLOMAX) 0.4 MG CAPS capsule Take 0.4 mg by mouth daily after supper.    [provider]    Allergies: Crestor [rosuvastatin] and Simvastatin    Review of Systems  Musculoskeletal:  Positive for back pain.    Updated Vital Signs BP (!) 144/73 (BP Location: Right Arm)   Pulse 74   Temp 98.1 F (36.7 C) (Oral)   Resp 18   Ht 5' 6 (1.676 m)   Wt 72.6 kg   SpO2 96%   BMI 25.82 kg/m   Physical Exam Vitals and nursing note reviewed.  Constitutional:      Appearance: He is well-developed.  HENT:     Head: Normocephalic and atraumatic.  Cardiovascular:     Rate and Rhythm: Normal rate.  Pulmonary:     Effort: Pulmonary effort is normal. No respiratory distress.  Abdominal:     General: There is no distension.  Musculoskeletal:        General: No tenderness. Normal range of motion.     Cervical back: Normal range of motion.  Skin:    General: Skin is warm  and dry.  Neurological:     General: No focal deficit present.     Mental Status: He is alert.     Comments: Symmetric and normal strength in bilateral leg raise, dorsiflexion, plantarflexion. Normal sensation symetrically.      (all labs ordered are listed, but only abnormal results are displayed) Labs Reviewed  COMPREHENSIVE METABOLIC PANEL WITH GFR - Abnormal; Notable for the following components:      Result Value   Glucose, Bld 114 (*)    Total Protein 6.4 (*)    All other components within normal limits  CBC WITH DIFFERENTIAL/PLATELET  URINALYSIS, W/ REFLEX TO CULTURE (INFECTION SUSPECTED)    EKG: None  Radiology: DG Lumbar Spine Complete Result Date: 06/21/2024 EXAM: 4 VIEW(S) XRAY OF THE LUMBAR SPINE 06/21/2024 01:26:00 AM COMPARISON: None available.  CLINICAL HISTORY: evaluate for pain FINDINGS: LUMBAR SPINE: BONES: Vertebral body heights are maintained. Alignment is normal. DISCS AND DEGENERATIVE CHANGES: Mild multilevel degenerative disc disease. SOFT TISSUES: Calcific aortic atherosclerosis. IMPRESSION: 1. No acute findings. 2. Mild multilevel degenerative disc disease. Electronically signed by: Franky Stanford MD 06/21/2024 03:00 AM EST RP Workstation: [id number]     Procedures   Medications Ordered in the ED  oxyCODONE -acetaminophen  (PERCOCET/ROXICET) 5-325 MG per tablet 1 tablet (1 tablet Oral Given 06/21/24 0103)  ibuprofen  (ADVIL ) tablet 800 mg (800 mg Oral Given 06/21/24 0103)                                    Medical Decision Making Amount and/or Complexity of Data Reviewed Labs: ordered. Radiology: ordered.  Risk Prescription drug management.   No red flags but secondary to age and memory issues, labs and xr done to ensure no obvious abnormalities and this was reassuring. Pain improved. Ambulates at baseline. Stable for discharge. Family aware of his memory issues and will continue to help.   Final diagnoses:  Acute midline low back pain, unspecified whether sciatica present  Memory impairment    ED Discharge Orders     None          Mahin Guardia, Selinda, MD 06/21/24 [id number]    Lorette Selinda, MD 06/21/24 [phone]  "

## 2024-06-21 NOTE — ED Notes (Signed)
 Patient's daughter Corey Glover called stating that she will not be able to come to ER. That her son had fever and now she has fever, concerned they have Flu and did not want to further expose him and staff.

## 2024-07-11 ENCOUNTER — Other Ambulatory Visit: Payer: Self-pay | Admitting: Urology

## 2024-07-11 ENCOUNTER — Telehealth: Payer: Self-pay | Admitting: Neurology

## 2024-07-11 ENCOUNTER — Telehealth: Payer: Self-pay | Admitting: Urology

## 2024-07-11 DIAGNOSIS — R350 Frequency of micturition: Secondary | ICD-10-CM

## 2024-07-11 MED ORDER — SOLIFENACIN SUCCINATE 10 MG PO TABS: 10.0000 mg | ORAL_TABLET | Freq: Every day | ORAL | 11 refills | Status: AC

## 2024-07-11 NOTE — Telephone Encounter (Signed)
 Please advise.

## 2024-07-11 NOTE — Telephone Encounter (Signed)
 Patient states Myrbetriq  is not covered anymore and Dr D stated he had something else he could send in. Please send to [facility]

## 2024-07-11 NOTE — Telephone Encounter (Signed)
 Called patient and lvm okay per DPR letting patient know that MD has sent in Rx

## 2024-07-11 NOTE — Telephone Encounter (Signed)
 Request to cancel appointment , made by daughter.

## 2024-08-11 ENCOUNTER — Ambulatory Visit: Admitting: Neurology

## 2024-10-12 ENCOUNTER — Ambulatory Visit: Admitting: Urology
